# Patient Record
Sex: Male | Born: 1970
Health system: Southern US, Community
[De-identification: ages and names within clinical notes are randomized; demographics above are authoritative.]

## PROBLEM LIST (undated history)

## (undated) DIAGNOSIS — I10 Essential (primary) hypertension: Secondary | ICD-10-CM

## (undated) DIAGNOSIS — K219 Gastro-esophageal reflux disease without esophagitis: Secondary | ICD-10-CM

## (undated) HISTORY — PX: TONSILLECTOMY: SUR1361

## (undated) HISTORY — PX: VASECTOMY: SHX75

---

## 2000-06-23 ENCOUNTER — Emergency Department (HOSPITAL_COMMUNITY): Admission: EM | Admit: 2000-06-23 | Discharge: 2000-06-23 | Payer: Self-pay | Admitting: Emergency Medicine

## 2007-11-19 ENCOUNTER — Emergency Department (HOSPITAL_COMMUNITY): Admission: EM | Admit: 2007-11-19 | Discharge: 2007-11-19 | Payer: Self-pay | Admitting: Emergency Medicine

## 2008-11-22 ENCOUNTER — Emergency Department (HOSPITAL_COMMUNITY): Admission: EM | Admit: 2008-11-22 | Discharge: 2008-11-22 | Payer: Self-pay | Admitting: Family Medicine

## 2009-03-22 ENCOUNTER — Emergency Department (HOSPITAL_COMMUNITY): Admission: EM | Admit: 2009-03-22 | Discharge: 2009-03-22 | Payer: Self-pay | Admitting: Emergency Medicine

## 2011-04-21 LAB — URINALYSIS, ROUTINE W REFLEX MICROSCOPIC
Nitrite: NEGATIVE
Specific Gravity, Urine: 1.025
Urobilinogen, UA: 1
pH: 5.5

## 2011-04-21 LAB — COMPREHENSIVE METABOLIC PANEL
BUN: 21
CO2: 23
Calcium: 11.2 — ABNORMAL HIGH
Creatinine, Ser: 2.29 — ABNORMAL HIGH
GFR calc Af Amer: 40 — ABNORMAL LOW
GFR calc non Af Amer: 33 — ABNORMAL LOW
Glucose, Bld: 124 — ABNORMAL HIGH
Total Bilirubin: 1.5 — ABNORMAL HIGH

## 2011-04-21 LAB — URINE MICROSCOPIC-ADD ON

## 2016-09-03 ENCOUNTER — Telehealth: Payer: Self-pay | Admitting: Cardiovascular Disease

## 2016-09-03 NOTE — Telephone Encounter (Signed)
[facility] 09/11/16 with Dr Allyson SabalBerry.  Records put with Dr Hazle CocaBerry's schedule for 09/11/16. lp

## 2016-09-04 ENCOUNTER — Telehealth (HOSPITAL_COMMUNITY): Payer: Self-pay

## 2016-09-04 NOTE — Telephone Encounter (Signed)
Encounter complete. 

## 2016-09-07 ENCOUNTER — Emergency Department (HOSPITAL_COMMUNITY): Payer: BLUE CROSS/BLUE SHIELD

## 2016-09-07 ENCOUNTER — Emergency Department (HOSPITAL_COMMUNITY)
Admission: EM | Admit: 2016-09-07 | Discharge: 2016-09-08 | Disposition: A | Discharge status: Home / Self Care | Payer: BLUE CROSS/BLUE SHIELD | Attending: Emergency Medicine | Admitting: Emergency Medicine

## 2016-09-07 ENCOUNTER — Encounter (HOSPITAL_COMMUNITY): Payer: Self-pay | Admitting: *Deleted

## 2016-09-07 DIAGNOSIS — Z7982 Long term (current) use of aspirin: Secondary | ICD-10-CM | POA: Insufficient documentation

## 2016-09-07 DIAGNOSIS — Z79899 Other long term (current) drug therapy: Secondary | ICD-10-CM | POA: Diagnosis not present

## 2016-09-07 DIAGNOSIS — I10 Essential (primary) hypertension: Secondary | ICD-10-CM | POA: Insufficient documentation

## 2016-09-07 DIAGNOSIS — R0789 Other chest pain: Secondary | ICD-10-CM | POA: Diagnosis not present

## 2016-09-07 HISTORY — DX: Gastro-esophageal reflux disease without esophagitis: K21.9

## 2016-09-07 HISTORY — DX: Essential (primary) hypertension: I10

## 2016-09-07 LAB — I-STAT TROPONIN, ED: Troponin i, poc: 0 ng/mL (ref 0.00–0.08)

## 2016-09-07 LAB — CBC
HCT: 41.7 % (ref 39.0–52.0)
Hemoglobin: 15 g/dL (ref 13.0–17.0)
MCH: 31.3 pg (ref 26.0–34.0)
MCHC: 36 g/dL (ref 30.0–36.0)
MCV: 86.9 fL (ref 78.0–100.0)
PLATELETS: 250 10*3/uL (ref 150–400)
RBC: 4.8 MIL/uL (ref 4.22–5.81)
RDW: 13.1 % (ref 11.5–15.5)
WBC: 8.9 10*3/uL (ref 4.0–10.5)

## 2016-09-07 LAB — BASIC METABOLIC PANEL
Anion gap: 8 (ref 5–15)
BUN: 16 mg/dL (ref 6–20)
CHLORIDE: 102 mmol/L (ref 101–111)
CO2: 26 mmol/L (ref 22–32)
CREATININE: 1.39 mg/dL — AB (ref 0.61–1.24)
Calcium: 9.3 mg/dL (ref 8.9–10.3)
GFR calc non Af Amer: >60 mL/min (ref >60)
GLUCOSE: 118 mg/dL — AB (ref 65–99)
Potassium: 3.9 mmol/L (ref 3.5–5.1)
Sodium: 136 mmol/L (ref 135–145)

## 2016-09-07 NOTE — ED Triage Notes (Signed)
Pt reports sweats and restlessness through the night, "felt like heartburn". Broke out into a sweat this afternoon and has felt tired since. Pt states that he is scheduled for stress test tomorrow after seeing his PCP for chest pain.

## 2016-09-07 NOTE — ED Provider Notes (Addendum)
[facility]-EMERGENCY DEPT Provider Note   CSN: [id number] Arrival date & time: 09/07/16  2026     History   Chief Complaint Chief Complaint  Patient presents with  . Fatigue    HPI Jorge Kramer is a 46 y.o. male.Patient reports that he's had intermittent chest tightness for one month. Last night he "possibly turned" in bed all night with heartburn. And was sweaty. This afternoon he had an episode lasting 1 minute where he became diaphoretic with no other symptoms denies chest pain or shortness of breath. No treatment prior to coming here today. He did treat himself with one dose of aspirin last night. He has seen his primary care physician Dr.Pharr for chest tightness over the past month and Dr. Renne Crigler has arranged for him to have an outpatient stress test tomorrow. Patient is presently asymptomatic. No other associated symptoms. Nothing makes symptoms better or worse.  HPI  Past Medical History:  Diagnosis Date  . GERD (gastroesophageal reflux disease)   . Hypertension     There are no active problems to display for this patient.   Past Surgical History:  Procedure Laterality Date  . TONSILLECTOMY    . VASECTOMY         Home Medications    Prior to Admission medications   Medication Sig Start Date End Date Taking? Authorizing Provider  aspirin 325 MG EC tablet Take 325 mg by mouth daily.   Yes Historical Provider, MD  losartan (COZAAR) 100 MG tablet Take 100 mg by mouth daily. 08/16/16  Yes Historical Provider, MD  pantoprazole (PROTONIX) 40 MG tablet Take 40 mg by mouth daily. 08/12/16  Yes Historical Provider, MD    Family History No family history on file.  Social History Social History  Substance Use Topics  . Smoking status: Never Smoker  . Smokeless tobacco: Never Used  . Alcohol use No     Allergies   Patient has no known allergies.   Review of Systems Review of Systems  Constitutional: Positive for diaphoresis and fatigue.       Patient has been  fatigued today, however he lie awake all night last night  HENT: Negative.   Respiratory: Negative.   Cardiovascular: Positive for chest pain.       Syncope  Gastrointestinal: Negative.   Musculoskeletal: Negative.   Skin: Negative.   Allergic/Immunologic: Negative.   Neurological: Negative.   Psychiatric/Behavioral: Negative.   All other systems reviewed and are negative.    Physical Exam Updated Vital Signs BP 132/89   Pulse 68   Temp 99 F (37.2 C)   Resp 21   Ht 5\' 8"  (1.727 m)   Wt 233 lb (105.7 kg)   SpO2 97%   BMI 35.43 kg/m   Physical Exam  Constitutional: He appears well-developed and well-nourished.  HENT:  Head: Normocephalic and atraumatic.  Eyes: Conjunctivae are normal. Pupils are equal, round, and reactive to light.  Neck: Neck supple. No tracheal deviation present. No thyromegaly present.  Cardiovascular: Normal rate and regular rhythm.   No murmur heard. Pulmonary/Chest: Effort normal and breath sounds normal.  Abdominal: Soft. Bowel sounds are normal. He exhibits no distension. There is no tenderness.  Musculoskeletal: Normal range of motion. He exhibits no edema or tenderness.  Neurological: He is alert. Coordination normal.  Skin: Skin is warm and dry. No rash noted.  Psychiatric: He has a normal mood and affect.  Nursing note and vitals reviewed.    ED Treatments / Results  Labs (all labs  ordered are listed, but only abnormal results are displayed) Labs Reviewed  BASIC METABOLIC PANEL - Abnormal; Notable for the following:       Result Value   Glucose, Bld 118 (*)    Creatinine, Ser 1.39 (*)    All other components within normal limits  CBC  I-STAT TROPOININ, ED   Results for orders placed or performed during the hospital encounter of 09/07/16  Basic metabolic panel  Result Value Ref Range   Sodium 136 135 - 145 mmol/L   Potassium 3.9 3.5 - 5.1 mmol/L   Chloride 102 101 - 111 mmol/L   CO2 26 22 - 32 mmol/L   Glucose, Bld 118 (H) 65  - 99 mg/dL   BUN 16 6 - 20 mg/dL   Creatinine, Ser 4.09 (H) 0.61 - 1.24 mg/dL   Calcium 9.3 8.9 - 81.1 mg/dL   GFR calc non Af Amer >60 >60 mL/min   GFR calc Af Amer >60 >60 mL/min   Anion gap 8 5 - 15  CBC  Result Value Ref Range   WBC 8.9 4.0 - 10.5 K/uL   RBC 4.80 4.22 - 5.81 MIL/uL   Hemoglobin 15.0 13.0 - 17.0 g/dL   HCT 91.4 78.2 - 95.6 %   MCV 86.9 78.0 - 100.0 fL   MCH 31.3 26.0 - 34.0 pg   MCHC 36.0 30.0 - 36.0 g/dL   RDW 21.3 08.6 - 57.8 %   Platelets 250 150 - 400 K/uL  I-stat troponin, ED  Result Value Ref Range   Troponin i, poc 0.00 0.00 - 0.08 ng/mL   Comment 3          I-stat troponin, ED  Result Value Ref Range   Troponin i, poc 0.00 0.00 - 0.08 ng/mL   Comment 3           Dg Chest 2 View  Result Date: 09/07/2016 CLINICAL DATA:  Anterior chest pain x5 days EXAM: CHEST  2 VIEW COMPARISON:  09/02/2016 CXR FINDINGS: The heart size and mediastinal contours are within normal limits. Both lungs are clear. The visualized skeletal structures are unremarkable. IMPRESSION: No active cardiopulmonary disease. Electronically Signed   By: Tollie Eth M.D.   On: 09/07/2016 21:19    EKG  EKG Interpretation  Date/Time:  Monday September 07 2016 20:32:44 EST Ventricular Rate:  61 PR Interval:    QRS Duration: 98 QT Interval:  434 QTC Calculation: 438 R Axis:   35 Text Interpretation:  Sinus rhythm Consider right atrial enlargement No old tracing to compare Confirmed by Ethelda Chick  MD, Antoin Dargis [phone]) on 09/07/2016 8:52:53 PM       Radiology Dg Chest 2 View  Result Date: 09/07/2016 CLINICAL DATA:  Anterior chest pain x5 days EXAM: CHEST  2 VIEW COMPARISON:  09/02/2016 CXR FINDINGS: The heart size and mediastinal contours are within normal limits. Both lungs are clear. The visualized skeletal structures are unremarkable. IMPRESSION: No active cardiopulmonary disease. Electronically Signed   By: Tollie Eth M.D.   On: 09/07/2016 21:19   Chest xray viewed by  me Procedures Procedures (including critical care time)  Medications Ordered in ED Medications - No data to display   Initial Impression / Assessment and Plan / ED Course  I have reviewed the triage vital signs and the nursing notes.  Pertinent labs & imaging results that were available during my care of the patient were reviewed by me and considered in my medical decision making (see chart for details).  12:30 AM patient remains asymptomatic. Plan keep scheduled appointment for stress test 3 PM today. He has follow-up appointment with cardiology in 3 days. Heart score equals 1 to 2 Final Clinical Impressions(s) / ED Diagnoses  Diagnosis#1 atypical chest pain #562mild renal insufficiency Final diagnoses:  None    New Prescriptions New Prescriptions   No medications on file     Doug SouSam Cecelia Graciano, MD 09/08/16 [id number]    Doug SouSam Haani Bakula, MD 09/08/16 [phone]

## 2016-09-07 NOTE — ED Notes (Signed)
Bed: WA08 Expected date:  Expected time:  Means of arrival:  Comments: Hold per charge 

## 2016-09-07 NOTE — ED Notes (Signed)
Pt reports he was seen by PCP and ekg/cxr was completed at that time.

## 2016-09-08 ENCOUNTER — Encounter (HOSPITAL_COMMUNITY): Payer: Self-pay

## 2016-09-08 ENCOUNTER — Ambulatory Visit (INDEPENDENT_AMBULATORY_CARE_PROVIDER_SITE_OTHER): Payer: BLUE CROSS/BLUE SHIELD | Admitting: Cardiovascular Disease

## 2016-09-08 ENCOUNTER — Encounter: Payer: Self-pay | Admitting: Cardiovascular Disease

## 2016-09-08 ENCOUNTER — Other Ambulatory Visit: Payer: Self-pay | Admitting: Cardiovascular Disease

## 2016-09-08 ENCOUNTER — Other Ambulatory Visit: Payer: Self-pay | Admitting: Internal Medicine

## 2016-09-08 ENCOUNTER — Ambulatory Visit (HOSPITAL_COMMUNITY)
Admission: RE | Admit: 2016-09-08 | Discharge: 2016-09-08 | Disposition: A | Discharge status: Home / Self Care | Payer: BLUE CROSS/BLUE SHIELD | Source: Ambulatory Visit | Attending: Cardiovascular Disease | Admitting: Cardiovascular Disease

## 2016-09-08 VITALS — BP 148/93 | HR 70 | Ht 68.0 in | Wt 235.4 lb

## 2016-09-08 DIAGNOSIS — I10 Essential (primary) hypertension: Secondary | ICD-10-CM | POA: Insufficient documentation

## 2016-09-08 DIAGNOSIS — R079 Chest pain, unspecified: Secondary | ICD-10-CM | POA: Insufficient documentation

## 2016-09-08 DIAGNOSIS — I200 Unstable angina: Secondary | ICD-10-CM

## 2016-09-08 DIAGNOSIS — Z01812 Encounter for preprocedural laboratory examination: Secondary | ICD-10-CM | POA: Diagnosis not present

## 2016-09-08 LAB — I-STAT TROPONIN, ED: TROPONIN I, POC: 0 ng/mL (ref 0.00–0.08)

## 2016-09-08 LAB — CBC WITH DIFFERENTIAL/PLATELET
BASOS PCT: 1 %
Basophils Absolute: 72 cells/uL (ref 0–200)
Eosinophils Absolute: 144 cells/uL (ref 15–500)
Eosinophils Relative: 2 %
HEMATOCRIT: 46.8 % (ref 38.5–50.0)
HEMOGLOBIN: 16.1 g/dL (ref 13.2–17.1)
LYMPHS ABS: 2232 {cells}/uL (ref 850–3900)
Lymphocytes Relative: 31 %
MCH: 30.8 pg (ref 27.0–33.0)
MCHC: 34.4 g/dL (ref 32.0–36.0)
MCV: 89.7 fL (ref 80.0–100.0)
MONO ABS: 792 {cells}/uL (ref 200–950)
MPV: 9.9 fL (ref 7.5–12.5)
Monocytes Relative: 11 %
NEUTROS ABS: 3960 {cells}/uL (ref 1500–7800)
NEUTROS PCT: 55 %
Platelets: 265 10*3/uL (ref 140–400)
RBC: 5.22 MIL/uL (ref 4.20–5.80)
RDW: 13.8 % (ref 11.0–15.0)
WBC: 7.2 10*3/uL (ref 3.8–10.8)

## 2016-09-08 LAB — LIPID PANEL
CHOLESTEROL: 284 mg/dL — AB (ref <200)
HDL: 37 mg/dL — AB (ref >40)
LDL Cholesterol: 194 mg/dL — ABNORMAL HIGH (ref <100)
TRIGLYCERIDES: 264 mg/dL — AB (ref <150)
Total CHOL/HDL Ratio: 7.7 Ratio — ABNORMAL HIGH (ref <5.0)
VLDL: 53 mg/dL — ABNORMAL HIGH (ref <30)

## 2016-09-08 LAB — APTT: aPTT: 29 s (ref 22–34)

## 2016-09-08 LAB — HEPATIC FUNCTION PANEL
ALBUMIN: 4.7 g/dL (ref 3.6–5.1)
ALK PHOS: 73 U/L (ref 40–115)
ALT: 63 U/L — AB (ref 9–46)
AST: 43 U/L — AB (ref 10–40)
Bilirubin, Direct: 0.1 mg/dL (ref <=0.2)
Indirect Bilirubin: 0.7 mg/dL (ref 0.2–1.2)
TOTAL PROTEIN: 7.3 g/dL (ref 6.1–8.1)
Total Bilirubin: 0.8 mg/dL (ref 0.2–1.2)

## 2016-09-08 LAB — PROTIME-INR
INR: 1
PROTHROMBIN TIME: 10.6 s (ref 9.0–11.5)

## 2016-09-08 NOTE — Discharge Instructions (Signed)
Keep your scheduled appointment for a stress test later today. Keep your scheduled appointment with Dr.Berry in 3 days. Return if concern for any reason

## 2016-09-08 NOTE — Assessment & Plan Note (Signed)
History of hypertension blood pressure measured today at 148/93. He is on losartan. Continue current meds at current dosing

## 2016-09-08 NOTE — Progress Notes (Signed)
09/08/2016 Jorge Kramer   10/16/1970  [id number]  Primary Physician Jorge Kramer,Jorge DAVIDSON, MD Primary Cardiologist: Runell GessJonathan J Chico Cawood MD Jorge RenoFACP, FACC, FAHA, FSCAI  HPI:  Mr. Jorge Conferpperson is a delightful 46 year old mildly overweight married Caucasian male father of  2 children and is accompanied by his wife Jorge Kramer who is a Engineer, civil (consulting)nurse at [employer].He is a Production designer, theatre/television/filmmanager at [employer].  He is being seen today for evaluation of new onset dyspnea on exertion over the last month as well as 3 episodes of chest pain lasting 30 seconds at time associated with diaphoresis. He was seen in the emergency room last night and ruled out for myocardial infarction. His EKG showed no acute changes. He was scheduled for a routine GXT this afternoon.   Current Outpatient Prescriptions  Medication Sig Dispense Refill  . aspirin 325 MG EC tablet Take 325 mg by mouth daily.    Marland Kitchen. losartan (COZAAR) 100 MG tablet Take 100 mg by mouth daily.  98  . pantoprazole (PROTONIX) 40 MG tablet Take 40 mg by mouth daily.  3   No current facility-administered medications for this visit.     No Known Allergies  Social History   Social History  . Marital status: Married    Spouse name: N/A  . Number of children: N/A  . Years of education: N/A   Occupational History  . Not on file.   Social History Main Topics  . Smoking status: Never Smoker  . Smokeless tobacco: Never Used  . Alcohol use No  . Drug use: No  . Sexual activity: Not on file   Other Topics Concern  . Not on file   Social History Narrative  . No narrative on file     Review of Systems: General: negative for chills, fever, night sweats or weight changes.  Cardiovascular: negative for chest pain, dyspnea on exertion, edema, orthopnea, palpitations, paroxysmal nocturnal dyspnea or shortness of breath Dermatological: negative for rash Respiratory: negative for cough or wheezing Urologic: negative for hematuria Abdominal: negative for nausea,  vomiting, diarrhea, bright red blood per rectum, melena, or hematemesis Neurologic: negative for visual changes, syncope, or dizziness All other systems reviewed and are otherwise negative except as noted above.    Blood pressure (!) 148/93, pulse 70, height 5\' 8"  (1.727 m), weight 235 lb 6.4 oz (106.8 kg), SpO2 95 %.  General appearance: alert and no distress Neck: no adenopathy, no carotid bruit, no JVD, supple, symmetrical, trachea midline and thyroid not enlarged, symmetric, no tenderness/mass/nodules Lungs: clear to auscultation bilaterally Heart: regular rate and rhythm, S1, S2 normal, no murmur, click, rub or gallop Extremities: extremities normal, atraumatic, no cyanosis or edema  EKG not performed today, EKG performed yesterday in the emergency room showed sinus rhythm at 61 without ST or T segment changes. I personally reviewed this EKG  ASSESSMENT AND PLAN:   Essential hypertension History of hypertension blood pressure measured today at 148/93. He is on losartan. Continue current meds at current dosing  Chest pain Jorge Kramer presents today for evaluation of new onset dyspnea on exertion over the last month and 3 episodes of chest pain over the last week or so lasting 30 seconds at a time associated with diaphoresis. Sclerae factors are hypertension and untreated hyperlipidemia. He was scheduled for a routine GXT this afternoon. I decide to proceed directly to cardiac catheterization to define his anatomy.The patient understands that risks included but are not limited to stroke (1 in 1000), death (1 in 1000), kidney  failure [usually temporary] (1 in 500), bleeding (1 in 200), allergic reaction [possibly serious] (1 in 200). The patient understands and agrees to proceed      Runell Gess MD [facility] 09/08/2016 11:56 AM

## 2016-09-08 NOTE — Assessment & Plan Note (Signed)
[name] presents today for evaluation of new onset dyspnea on exertion over the last month and 3 episodes of chest pain over the last week or so lasting 30 seconds at a time associated with diaphoresis. Sclerae factors are hypertension and untreated hyperlipidemia. He was scheduled for a routine GXT this afternoon. I decide to proceed directly to cardiac catheterization to define his anatomy.The patient understands that risks included but are not limited to stroke (1 in 1000), death (1 in 1000), kidney failure [usually temporary] (1 in 500), bleeding (1 in 200), allergic reaction [possibly serious] (1 in 200). The patient understands and agrees to proceed

## 2016-09-08 NOTE — Patient Instructions (Addendum)
Your physician has requested that you have a cardiac catheterization on Thursday, 09/10/16 with Dr. Allyson SabalBerry (Dx: Chest pain). Cardiac catheterization is used to diagnose and/or treat various heart conditions. Doctors may recommend this procedure for a number of different reasons. The most common reason is to evaluate chest pain. Chest pain can be a symptom of coronary artery disease (CAD), and cardiac catheterization can show whether plaque is narrowing or blocking your heart's arteries. This procedure is also used to evaluate the valves, as well as measure the blood flow and oxygen levels in different parts of your heart. For further information please visit [email or web address].   Following your catheterization, you will not be allowed to drive for 3 days.  No lifting, pushing, or pulling greater that 10 pounds is allowed for 1 week.  You will be required to have the following tests prior to the procedure:  1. Blood work-the blood work can be done no more than 14 days prior to the procedure.  It can be done at any [facility] lab.  There is one downstairs on the first floor of this building and one in the [facility] building [phone]([address], [address]).   Puncture site Rt. Radial  ---CANCEL STRESS TEST---  Your physician has recommended that you have a sleep study in 1-2 months. This test records several body functions during sleep, including: brain activity, eye movement, oxygen and carbon dioxide blood levels, heart rate and rhythm, breathing rate and rhythm, the flow of air through your mouth and nose, snoring, body muscle movements, and chest and belly movement.

## 2016-09-09 LAB — TSH: TSH: 8.26 mIU/L — ABNORMAL HIGH (ref 0.40–4.50)

## 2016-09-10 ENCOUNTER — Encounter (HOSPITAL_COMMUNITY)
Admission: RE | Disposition: A | Discharge status: Home / Self Care | Payer: Self-pay | Source: Ambulatory Visit | Attending: Cardiovascular Disease

## 2016-09-10 ENCOUNTER — Ambulatory Visit (HOSPITAL_COMMUNITY)
Admission: RE | Admit: 2016-09-10 | Discharge: 2016-09-10 | Disposition: A | Discharge status: Home / Self Care | Payer: BLUE CROSS/BLUE SHIELD | Source: Ambulatory Visit | Attending: Cardiovascular Disease | Admitting: Cardiovascular Disease

## 2016-09-10 DIAGNOSIS — R079 Chest pain, unspecified: Secondary | ICD-10-CM

## 2016-09-10 DIAGNOSIS — E663 Overweight: Secondary | ICD-10-CM | POA: Diagnosis not present

## 2016-09-10 DIAGNOSIS — Z7982 Long term (current) use of aspirin: Secondary | ICD-10-CM | POA: Insufficient documentation

## 2016-09-10 DIAGNOSIS — I200 Unstable angina: Secondary | ICD-10-CM | POA: Diagnosis not present

## 2016-09-10 DIAGNOSIS — I10 Essential (primary) hypertension: Secondary | ICD-10-CM | POA: Insufficient documentation

## 2016-09-10 DIAGNOSIS — Z6835 Body mass index (BMI) 35.0-35.9, adult: Secondary | ICD-10-CM | POA: Insufficient documentation

## 2016-09-10 DIAGNOSIS — E785 Hyperlipidemia, unspecified: Secondary | ICD-10-CM | POA: Insufficient documentation

## 2016-09-10 HISTORY — PX: LEFT HEART CATH AND CORONARY ANGIOGRAPHY: CATH118249

## 2016-09-10 SURGERY — LEFT HEART CATH AND CORONARY ANGIOGRAPHY
Anesthesia: LOCAL

## 2016-09-10 MED ORDER — HEPARIN (PORCINE) IN NACL 2-0.9 UNIT/ML-% IJ SOLN
INTRAMUSCULAR | Status: DC | PRN
  Administered 2016-09-10: 1000 mL

## 2016-09-10 MED ORDER — HEPARIN (PORCINE) IN NACL 2-0.9 UNIT/ML-% IJ SOLN
INTRAMUSCULAR | Status: AC
  Filled 2016-09-10: qty 1000

## 2016-09-10 MED ORDER — LIDOCAINE HCL (PF) 1 % IJ SOLN
INTRAMUSCULAR | Status: AC
  Filled 2016-09-10: qty 30

## 2016-09-10 MED ORDER — SODIUM CHLORIDE 0.9 % WEIGHT BASED INFUSION
3.0000 mL/kg/h | INTRAVENOUS | Status: DC
  Administered 2016-09-10: 3 mL/kg/h via INTRAVENOUS

## 2016-09-10 MED ORDER — IOPAMIDOL (ISOVUE-370) INJECTION 76%
INTRAVENOUS | Status: AC
Start: 2016-09-10 — End: 2016-09-10
  Filled 2016-09-10: qty 100

## 2016-09-10 MED ORDER — VERAPAMIL HCL 2.5 MG/ML IV SOLN
INTRAVENOUS | Status: AC
  Filled 2016-09-10: qty 2

## 2016-09-10 MED ORDER — IOPAMIDOL (ISOVUE-370) INJECTION 76%
INTRAVENOUS | Status: DC | PRN
  Administered 2016-09-10: 60 mL via INTRA_ARTERIAL

## 2016-09-10 MED ORDER — SODIUM CHLORIDE 0.9 % IV SOLN: INTRAVENOUS | Status: AC

## 2016-09-10 MED ORDER — MORPHINE SULFATE (PF) 4 MG/ML IV SOLN: 2.0000 mg | INTRAVENOUS | Status: DC | PRN

## 2016-09-10 MED ORDER — SODIUM CHLORIDE 0.9% FLUSH: 3.0000 mL | INTRAVENOUS | Status: DC | PRN

## 2016-09-10 MED ORDER — ASPIRIN 81 MG PO CHEW: 81.0000 mg | CHEWABLE_TABLET | ORAL | Status: DC

## 2016-09-10 MED ORDER — ONDANSETRON HCL 4 MG/2ML IJ SOLN: 4.0000 mg | Freq: Four times a day (QID) | INTRAMUSCULAR | Status: DC | PRN

## 2016-09-10 MED ORDER — SODIUM CHLORIDE 0.9 % WEIGHT BASED INFUSION: 1.0000 mL/kg/h | INTRAVENOUS | Status: DC

## 2016-09-10 MED ORDER — SODIUM CHLORIDE 0.9 % IV SOLN: 250.0000 mL | INTRAVENOUS | Status: DC | PRN

## 2016-09-10 MED ORDER — NITROGLYCERIN 1 MG/10 ML FOR IR/CATH LAB
INTRA_ARTERIAL | Status: AC
  Filled 2016-09-10: qty 10

## 2016-09-10 MED ORDER — HEPARIN SODIUM (PORCINE) 1000 UNIT/ML IJ SOLN
INTRAMUSCULAR | Status: AC
  Filled 2016-09-10: qty 1

## 2016-09-10 MED ORDER — SODIUM CHLORIDE 0.9% FLUSH: 3.0000 mL | Freq: Two times a day (BID) | INTRAVENOUS | Status: DC

## 2016-09-10 MED ORDER — LIDOCAINE HCL (PF) 1 % IJ SOLN
INTRAMUSCULAR | Status: DC | PRN
  Administered 2016-09-10: 2 mL via INTRADERMAL

## 2016-09-10 MED ORDER — VERAPAMIL HCL 2.5 MG/ML IV SOLN
INTRA_ARTERIAL | Status: DC | PRN
  Administered 2016-09-10: 10 mL via INTRA_ARTERIAL

## 2016-09-10 MED ORDER — ACETAMINOPHEN 325 MG PO TABS: 650.0000 mg | ORAL_TABLET | ORAL | Status: DC | PRN

## 2016-09-10 MED ORDER — HEPARIN SODIUM (PORCINE) 1000 UNIT/ML IJ SOLN
INTRAMUSCULAR | Status: DC | PRN
  Administered 2016-09-10: 5000 [IU] via INTRAVENOUS

## 2016-09-10 SURGICAL SUPPLY — 13 items
CATH EXPO 5F FL3.5 (CATHETERS) ×1 IMPLANT
CATH EXPO 5FR ANG PIGTAIL 145 (CATHETERS) ×1 IMPLANT
CATH OPTITORQUE TIG 4.0 5F (CATHETERS) ×1 IMPLANT
DEVICE RAD COMP TR BAND LRG (VASCULAR PRODUCTS) ×1 IMPLANT
GLIDESHEATH SLEND A-KIT 6F 22G (SHEATH) ×1 IMPLANT
GUIDEWIRE INQWIRE 1.5J.035X260 (WIRE) IMPLANT
INQWIRE 1.5J .035X260CM (WIRE) ×2
KIT HEART LEFT (KITS) ×2 IMPLANT
PACK CARDIAC CATHETERIZATION (CUSTOM PROCEDURE TRAY) ×2 IMPLANT
SYR MEDRAD MARK V 150ML (SYRINGE) ×2 IMPLANT
TRANSDUCER W/STOPCOCK (MISCELLANEOUS) ×2 IMPLANT
TUBING CIL FLEX 10 FLL-RA (TUBING) ×2 IMPLANT
WIRE HI TORQ VERSACORE-J 145CM (WIRE) ×1 IMPLANT

## 2016-09-10 NOTE — Interval H&P Note (Signed)
Cath Lab Visit (complete for each Cath Lab visit)  Clinical Evaluation Leading to the Procedure:   ACS: No.  Non-ACS:    Anginal Classification: CCS II  Anti-ischemic medical therapy: No Therapy  Non-Invasive Test Results: No non-invasive testing performed  Prior CABG: No previous CABG      Cath Lab Visit (complete for each Cath Lab visit)  Clinical Evaluation Leading to the Procedure:   ACS: No.  Non-ACS:    Anginal Classification: CCS II  Anti-ischemic medical therapy: No Therapy  Non-Invasive Test Results: No non-invasive testing performed  Prior CABG: No previous CABG      History and Physical Interval Note:  09/10/2016 2:44 PM  Jorge Kramer  has presented today for surgery, with the diagnosis of cp  The various methods of treatment have been discussed with the patient and family. After consideration of risks, benefits and other options for treatment, the patient has consented to  Procedure(s): Left Heart Cath and Coronary Angiography (N/A) as a surgical intervention .  The patient's history has been reviewed, patient examined, no change in status, stable for surgery.  I have reviewed the patient's chart and labs.  Questions were answered to the patient's satisfaction.     Nanetta BattyBerry, Guss Farruggia

## 2016-09-10 NOTE — H&P (View-Only) (Signed)
   09/08/2016 Jorge Kramer   02/09/1971  [id number]  Primary Physician PHARR,WALTER DAVIDSON, MD Primary Cardiologist: Anjanette Gilkey J Sheikh Leverich MD FACP, FACC, FAHA, FSCAI  HPI:  [name] is a delightful 46-year-old mildly overweight married Caucasian male father of  2 children and is accompanied by his wife [name] who is a nurse at [employer].He is a manager at [employer].  He is being seen today for evaluation of new onset dyspnea on exertion over the last month as well as 3 episodes of chest pain lasting 30 seconds at time associated with diaphoresis. He was seen in the emergency room last night and ruled out for myocardial infarction. His EKG showed no acute changes. He was scheduled for a routine GXT this afternoon.   Current Outpatient Prescriptions  Medication Sig Dispense Refill  . aspirin 325 MG EC tablet Take 325 mg by mouth daily.    . losartan (COZAAR) 100 MG tablet Take 100 mg by mouth daily.  98  . pantoprazole (PROTONIX) 40 MG tablet Take 40 mg by mouth daily.  3   No current facility-administered medications for this visit.     No Known Allergies  Social History   Social History  . Marital status: Married    Spouse name: N/A  . Number of children: N/A  . Years of education: N/A   Occupational History  . Not on file.   Social History Main Topics  . Smoking status: Never Smoker  . Smokeless tobacco: Never Used  . Alcohol use No  . Drug use: No  . Sexual activity: Not on file   Other Topics Concern  . Not on file   Social History Narrative  . No narrative on file     Review of Systems: General: negative for chills, fever, night sweats or weight changes.  Cardiovascular: negative for chest pain, dyspnea on exertion, edema, orthopnea, palpitations, paroxysmal nocturnal dyspnea or shortness of breath Dermatological: negative for rash Respiratory: negative for cough or wheezing Urologic: negative for hematuria Abdominal: negative for nausea,  vomiting, diarrhea, bright red blood per rectum, melena, or hematemesis Neurologic: negative for visual changes, syncope, or dizziness All other systems reviewed and are otherwise negative except as noted above.    Blood pressure (!) 148/93, pulse 70, height 5' 8" (1.727 m), weight 235 lb 6.4 oz (106.8 kg), SpO2 95 %.  General appearance: alert and no distress Neck: no adenopathy, no carotid bruit, no JVD, supple, symmetrical, trachea midline and thyroid not enlarged, symmetric, no tenderness/mass/nodules Lungs: clear to auscultation bilaterally Heart: regular rate and rhythm, S1, S2 normal, no murmur, click, rub or gallop Extremities: extremities normal, atraumatic, no cyanosis or edema  EKG not performed today, EKG performed yesterday in the emergency room showed sinus rhythm at 61 without ST or T segment changes. I personally reviewed this EKG  ASSESSMENT AND PLAN:   Essential hypertension History of hypertension blood pressure measured today at 148/93. He is on losartan. Continue current meds at current dosing  Chest pain Mr. Eckenrode presents today for evaluation of new onset dyspnea on exertion over the last month and 3 episodes of chest pain over the last week or so lasting 30 seconds at a time associated with diaphoresis. Sclerae factors are hypertension and untreated hyperlipidemia. He was scheduled for a routine GXT this afternoon. I decide to proceed directly to cardiac catheterization to define his anatomy.The patient understands that risks included but are not limited to stroke (1 in 1000), death (1 in 1000), kidney   failure [usually temporary] (1 in 500), bleeding (1 in 200), allergic reaction [possibly serious] (1 in 200). The patient understands and agrees to proceed      Srihitha Tagliaferri J. Natacha Jepsen MD FACP,FACC,FAHA, FSCAI 09/08/2016 11:56 AM 

## 2016-09-10 NOTE — Discharge Instructions (Signed)

## 2016-09-11 ENCOUNTER — Encounter (HOSPITAL_COMMUNITY): Payer: Self-pay | Admitting: Cardiovascular Disease

## 2016-09-11 ENCOUNTER — Ambulatory Visit: Payer: Self-pay | Admitting: Cardiovascular Disease

## 2016-09-30 ENCOUNTER — Encounter: Payer: Self-pay | Admitting: Cardiovascular Disease

## 2016-09-30 ENCOUNTER — Ambulatory Visit (INDEPENDENT_AMBULATORY_CARE_PROVIDER_SITE_OTHER): Payer: BLUE CROSS/BLUE SHIELD | Admitting: Cardiovascular Disease

## 2016-09-30 DIAGNOSIS — E7800 Pure hypercholesterolemia, unspecified: Secondary | ICD-10-CM

## 2016-09-30 DIAGNOSIS — E785 Hyperlipidemia, unspecified: Secondary | ICD-10-CM | POA: Insufficient documentation

## 2016-09-30 DIAGNOSIS — I10 Essential (primary) hypertension: Secondary | ICD-10-CM | POA: Diagnosis not present

## 2016-09-30 DIAGNOSIS — I209 Angina pectoris, unspecified: Secondary | ICD-10-CM

## 2016-09-30 DIAGNOSIS — I259 Chronic ischemic heart disease, unspecified: Secondary | ICD-10-CM

## 2016-09-30 NOTE — Progress Notes (Signed)
09/30/2016 GRE[facility]   [birth year]  [id number]  Primary Physician Londell Moh, MD Primary Cardiologist: Runell Gess MD Roseanne Reno  HPI:  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 He was seen in the emergency room last night and ruled out for myocardial infarction. His EKG showed no acute changes. He was scheduled for a routine GXT , but because of the new onset of his symptoms I elected to proceed directly to cardiac cath to rule out ischemia. I perform this on 09/10/16 radially demonstrating normal coronary arteries and normal LV function. He was also found to have significant hyper lipidemia currently not on statin therapy as well as hypothyroidism. He was begun on thyroid replacement therapy symptoms are somewhat improved.   Current Outpatient Prescriptions  Medication Sig Dispense Refill  . aspirin EC 81 MG tablet Take 81 mg by mouth daily.    Marland Kitchen ibuprofen (ADVIL,MOTRIN) 200 MG tablet Take 600 mg by mouth every 6 (six) hours as needed for headache or mild pain.    Marland Kitchen losartan (COZAAR) 100 MG tablet Take 100 mg by mouth daily.  98  . pantoprazole (PROTONIX) 40 MG tablet Take 40 mg by mouth daily.  3  . levothyroxine (SYNTHROID, LEVOTHROID) 50 MCG tablet Take 1 tablet by mouth daily.  11   No current facility-administered medications for this visit.     No Known Allergies  Social History   Social History  . Marital status: Married    Spouse name: N/A  . Number of children: N/A  . Years of education: N/A   Occupational History  . Not on file.   Social History Main Topics  . Smoking status: Never Smoker  . Smokeless  tobacco: Never Used  . Alcohol use No  . Drug use: No  . Sexual activity: Not on file   Other Topics Concern  . Not on file   Social History Narrative  . No narrative on file     Review of Systems: General: negative for chills, fever, night sweats or weight changes.  Cardiovascular: negative for chest pain, dyspnea on exertion, edema, orthopnea, palpitations, paroxysmal nocturnal dyspnea or shortness of breath Dermatological: negative for rash Respiratory: negative for cough or wheezing Urologic: negative for hematuria Abdominal: negative for nausea, vomiting, diarrhea, bright red blood per rectum, melena, or hematemesis Neurologic: negative for visual changes, syncope, or dizziness All other systems reviewed and are otherwise negative except as noted above.    Blood pressure 130/86, pulse 68, height 5\' 8"  (1.727 m), weight 232 lb 6.4 oz (105.4 kg).  General appearance: alert and no distress Neck: no adenopathy, no carotid bruit, no JVD, supple, symmetrical, trachea midline and thyroid not enlarged, symmetric, no tenderness/mass/nodules Lungs: clear to auscultation bilaterally Heart: regular rate and rhythm, S1, S2 normal, no murmur, click, rub or gallop Extremities: extremities normal, atraumatic, no cyanosis or edema  EKG not performed today  ASSESSMENT AND PLAN:   Essential hypertension History of hypertension blood pressure measured 130/86. He is on losartan. Continue current meds at current dosing  Chest pain History of chest pain with positive risk factors. Recent crit catheterization performed radially by  myself 09/10/16 was entirely normal. His symptoms have somewhat improved after discovering that he was hypothyroid and being placed on thyroid replacement therapy.  Hyperlipidemia History of hyperlipidemia currently not on statin therapy with recent lipid profile performed 09/08/16 revealing a total cholesterol 284, LDL 194 and HDL of 37. He has changed his diet  significantly since being made aware of these results. I suspect that he will ultimately need statin therapy. I will leave this to Dr. Renne CriglerPharr to treat.      Runell GessJonathan J. Berry MD FACP,FACC,FAHA, [facility] 09/30/2016 9:33 AM

## 2016-09-30 NOTE — Patient Instructions (Signed)
Medication Instructions: Your physician recommends that you continue on your current medications as directed. Please refer to the Current Medication list given to you today.   Follow-Up: Your physician recommends that you schedule a follow-up appointment as needed with Dr. Berry.  If you need a refill on your cardiac medications before your next appointment, please call your pharmacy.  

## 2016-09-30 NOTE — Assessment & Plan Note (Signed)
History of chest pain with positive risk factors. Recent crit catheterization performed radially by myself 09/10/16 was entirely normal. His symptoms have somewhat improved after discovering that he was hypothyroid and being placed on thyroid replacement therapy.

## 2016-09-30 NOTE — Assessment & Plan Note (Signed)
History of hyperlipidemia currently not on statin therapy with recent lipid profile performed 09/08/16 revealing a total cholesterol 284, LDL 194 and HDL of 37. He has changed his diet significantly since being made aware of these results. I suspect that he will ultimately need statin therapy. I will leave this to Dr. Renne CriglerPharr to treat.

## 2016-09-30 NOTE — Assessment & Plan Note (Signed)
History of hypertension blood pressure measured 130/86. He is on losartan. Continue current meds at current dosing

## 2016-10-26 ENCOUNTER — Encounter (HOSPITAL_BASED_OUTPATIENT_CLINIC_OR_DEPARTMENT_OTHER): Payer: BLUE CROSS/BLUE SHIELD

## 2019-01-17 ENCOUNTER — Other Ambulatory Visit: Payer: Self-pay

## 2019-01-17 ENCOUNTER — Ambulatory Visit (INDEPENDENT_AMBULATORY_CARE_PROVIDER_SITE_OTHER): Payer: No Typology Code available for payment source | Admitting: Orthopaedic Surgery

## 2019-01-17 ENCOUNTER — Ambulatory Visit (INDEPENDENT_AMBULATORY_CARE_PROVIDER_SITE_OTHER): Payer: No Typology Code available for payment source

## 2019-01-17 DIAGNOSIS — G8929 Other chronic pain: Secondary | ICD-10-CM

## 2019-01-17 DIAGNOSIS — M25561 Pain in right knee: Secondary | ICD-10-CM | POA: Diagnosis not present

## 2019-01-17 NOTE — Progress Notes (Signed)
Office Visit Note   Patient: Jorge Kramer           Date of Birth: [birth year]           MRN: [id number] Visit Date: 01/17/2019              Requested by: Deland Pretty, MD [address] [place],  [place] [address] PCP: Deland Pretty, MD   Assessment & Plan: Visit Diagnoses:  1. Chronic pain of right knee     Plan: I feel that at this point he does need an MRI of his right knee to better assess the cartilage on the medial meniscus.  He is tried and failed conservative treatment for 3 years now including multiple steroid injections, activity modification, quad strengthening exercises, anti-inflammatories, rest and time.  Given his x-ray findings and his clinical exam findings I do feel this is medically warranted at this standpoint to further assess the medial compartment of his knee given his findings.  All question concerns were answered and addressed.  We will see him back once he has the MRI of his right knee.  Follow-Up Instructions: No follow-ups on file.  The patient will call for follow-up appointment once he has an MRI date.  Orders:  Orders Placed This Encounter  Procedures  . XR Knee 1-2 Views Right   No orders of the defined types were placed in this encounter.     Procedures: No procedures performed   Clinical Data: No additional findings.   Subjective: Chief Complaint  Patient presents with  . Right Knee - Pain  The patient is a very pleasant and active 48 year old gentleman that I am seeing for the first time but has been followed by another orthopedic surgeon in town for many years now.  He comes in with a chief complaint of right knee pain.  He does have intermittent swelling in that knee.  It hurts with exercise activities and putting weight through the medial side of his right knee is where he points to.  He has been having some locking and catching as well.  He has had the knee aspirated on several occasions.  He has had 3 steroid  injections in his right knee over the last 2 to 3 years.  He still has the recurrent symptoms of instability of that knee.  He is not obese.  He is very active and strong.  He is worked on a home exercise program to strengthen his quad muscles which are quite evident on just inspection.  He is not a diabetic and not a smoker.  He used to run quite a bit but he cannot do that now due to his right knee pain.  HPI  Review of Systems He currently denies any headache, chest pain, shortness of breath, fever, chills, nausea, vomiting  Objective: Vital Signs: There were no vitals taken for this visit.  Physical Exam He is alert and orient x3 and in no acute distress Ortho Exam Examination of his right knee shows slight varus malalignment that is easily correctable.  His extension is entirely full and his flexion is full.  He has significant medial joint line tenderness.  He has a positive McMurray sign to the medial compartment. Specialty Comments:  No specialty comments available.  Imaging: Xr Knee 1-2 Views Right  Result Date: 01/17/2019 2 views of the right knee show varus malalignment with significant medial joint space narrowing.  There are para-articular osteophytes in the medial and patellofemoral compartments.  PMFS History: Patient Active Problem List   Diagnosis Date Noted  . Hyperlipidemia 09/30/2016  . Essential hypertension 09/08/2016  . Chest pain 09/08/2016   Past Medical History:  Diagnosis Date  . GERD (gastroesophageal reflux disease)   . Hypertension     No family history on file.  Past Surgical History:  Procedure Laterality Date  . LEFT HEART CATH AND CORONARY ANGIOGRAPHY N/A 09/10/2016   Procedure: Left Heart Cath and Coronary Angiography;  Surgeon: Runell GessJonathan J Berry, MD;  [facility];  Service: Cardiovascular;  Laterality: N/A;  . TONSILLECTOMY    . VASECTOMY     Social History   Occupational History  . Not on file  Tobacco Use  .  Smoking status: Never Smoker  . Smokeless tobacco: Never Used  Substance and Sexual Activity  . Alcohol use: No  . Drug use: No  . Sexual activity: Not on file

## 2019-01-20 ENCOUNTER — Telehealth: Payer: Self-pay

## 2019-01-20 ENCOUNTER — Other Ambulatory Visit: Payer: Self-pay | Admitting: Orthopaedic Surgery

## 2019-01-20 MED ORDER — IBUPROFEN 800 MG PO TABS: 800.0000 mg | ORAL_TABLET | Freq: Three times a day (TID) | ORAL | 2 refills | Status: DC | PRN

## 2019-01-20 NOTE — Telephone Encounter (Signed)
Message left on Sabrina's voicemail. Pt is requesting a rx for Motrin 800 mg.

## 2019-01-20 NOTE — Telephone Encounter (Signed)
I called patient and advised.  Patient also left voicemail for Gabriel Cirri stating his insurance does not cover [facility] and would like for him to go to [facility] for MRI.  Fax number for [facility] is [phone].  Please send MRI order there.

## 2019-01-20 NOTE — Telephone Encounter (Signed)
I'll send some in. 

## 2019-01-20 NOTE — Telephone Encounter (Signed)
Want to call him in some 800 ibuprofen?

## 2019-01-24 NOTE — Telephone Encounter (Signed)
I will, working on it now.  Will update the referral, and I have called patient and LM

## 2019-01-24 NOTE — Telephone Encounter (Signed)
Looks like authorization was being worked on by [name]. [name] can you change to [place], thanks!

## 2019-02-07 ENCOUNTER — Encounter: Payer: Self-pay | Admitting: Orthopaedic Surgery

## 2019-02-07 ENCOUNTER — Other Ambulatory Visit: Payer: Self-pay

## 2019-02-07 ENCOUNTER — Ambulatory Visit (INDEPENDENT_AMBULATORY_CARE_PROVIDER_SITE_OTHER): Payer: No Typology Code available for payment source | Admitting: Orthopaedic Surgery

## 2019-02-07 DIAGNOSIS — M25561 Pain in right knee: Secondary | ICD-10-CM

## 2019-02-07 DIAGNOSIS — G8929 Other chronic pain: Secondary | ICD-10-CM

## 2019-02-07 DIAGNOSIS — S83241D Other tear of medial meniscus, current injury, right knee, subsequent encounter: Secondary | ICD-10-CM | POA: Diagnosis not present

## 2019-02-07 DIAGNOSIS — M25461 Effusion, right knee: Secondary | ICD-10-CM

## 2019-02-07 MED ORDER — METHYLPREDNISOLONE ACETATE 40 MG/ML IJ SUSP
40.0000 mg | INTRAMUSCULAR | Status: AC | PRN
  Administered 2019-02-07: 40 mg via INTRA_ARTICULAR

## 2019-02-07 MED ORDER — LIDOCAINE HCL 1 % IJ SOLN
3.0000 mL | INTRAMUSCULAR | Status: AC | PRN
  Administered 2019-02-07: 3 mL

## 2019-02-07 NOTE — Progress Notes (Signed)
Office Visit Note   Patient: Jorge Kramer           Date of Birth: 07/09/1971           MRN: [id number] Visit Date: 02/07/2019              Requested by: Merri BrunettePharr, Walter, MD [address] [place],  [place] [id number] PCP: Merri BrunettePharr, Walter, MD   Assessment & Plan: Visit Diagnoses: No diagnosis found.  Plan: I was able to aspirate 35 to 40 cc of fluid from his knee and then placed a steroid injection in the right knee.  This will help him on his trip.  For now I am recommending arthroscopic intervention of his knee with a partial medial meniscectomy and chondroplasty given the fact that he is only 48 years old.  If we can help temporize his symptoms and buy him some time before knee replacement surgery that would be worthwhile.  I showed him a knee model explained in detail what this involves and he is for proceeding with a knee scope in the future.  He does have our surgery scheduler's card.  He will call and let us know.  The other option would be considering a knee replacement.  Follow-Up Instructions: No follow-ups on file.   Orders:  No orders of the defined types were placed in this encounter.  No orders of the defined types were placed in this encounter.     Procedures: Large Joint Inj: R knee on 02/07/2019 9:55 AM Indications: diagnostic evaluation and pain Details: 22 G 1.5 in needle, superolateral approach  Arthrogram: No  Medications: 3 mL lidocaine 1 %; 40 mg methylPREDNISolone acetate 40 MG/ML Outcome: tolerated well, no immediate complications Procedure, treatment alternatives, risks and benefits explained, specific risks discussed. Consent was given by the patient. Immediately prior to procedure a time out was called to verify the correct patient, procedure, equipment, support staff and site/side marked as required. Patient was prepped and draped in the usual sterile fashion.       Clinical Data: No additional findings.   Subjective: Chief  Complaint  Patient presents with  . Right Knee - Follow-up  The patient comes in today to go over an MRI of his right knee.  He is a 48 year old gentleman knees had locking and catching with his knee as well as recurrent effusions.  He usually gets a steroid injection and aspiration a couple times a year.  The last for about 3 months.  The plain films of his knee did show varus malalignment with medial joint space narrowing and so was sent for an MRI to better evaluate the cartilage in the meniscus.  He has had a recurrent effusion again.  He is heading on vacation later this week and will be gone for over a week.  It is about 11-hour drive as well. HPI  Review of Systems He currently denies any headache, chest pain, shortness of breath, fever, chills, nausea, vomiting  Objective: Vital Signs: There were no vitals taken for this visit.  Physical Exam He is alert and orient x3 and in no acute distress Ortho Exam Examination of his right knee does show a recurrent effusion with varus malalignment.  Is got good range of motion the knee but medial joint line tenderness. Specialty Comments:  No specialty comments available.  Imaging: No results found. The MRI is independently reviewed with him and it does show a complex medial meniscal tear with areas of full-thickness cartilage loss  in the medial compartment of his knee.  There is some thinning of the cartilage on the lateral compartment and some cartilage loss of the patellofemoral joint.  PMFS History: Patient Active Problem List   Diagnosis Date Noted  . Hyperlipidemia 09/30/2016  . Essential hypertension 09/08/2016  . Chest pain 09/08/2016   Past Medical History:  Diagnosis Date  . GERD (gastroesophageal reflux disease)   . Hypertension     History reviewed. No pertinent family history.  Past Surgical History:  Procedure Laterality Date  . LEFT HEART CATH AND CORONARY ANGIOGRAPHY N/A 09/10/2016   Procedure: Left Heart Cath and  Coronary Angiography;  Surgeon: Lorretta Harp, MD;  Location: [facility];  Service: Cardiovascular;  Laterality: N/A;  . TONSILLECTOMY    . VASECTOMY     Social History   Occupational History  . Not on file  Tobacco Use  . Smoking status: Never Smoker  . Smokeless tobacco: Never Used  Substance and Sexual Activity  . Alcohol use: No  . Drug use: No  . Sexual activity: Not on file

## 2019-02-10 ENCOUNTER — Other Ambulatory Visit: Payer: No Typology Code available for payment source

## 2019-02-17 ENCOUNTER — Telehealth: Payer: Self-pay

## 2019-02-17 NOTE — Telephone Encounter (Signed)
Patient left voice mail regarding scheduling knee scope.  Will you please do surgery sheet?  I called him back and left voice mail for a return call.

## 2019-02-17 NOTE — Telephone Encounter (Signed)
The surgery that I would like to perform on him would be a right knee arthroscopy with a partial medial meniscectomy.  This is for an acute right knee medial meniscal tear.  The CPT code is [id number].  This could hopefully be done at the surgical center, but if not, in a lower location will do.

## 2019-02-23 NOTE — Telephone Encounter (Signed)
I called and scheduled surgery. 

## 2019-03-23 ENCOUNTER — Other Ambulatory Visit: Payer: Self-pay | Admitting: Orthopaedic Surgery

## 2019-03-23 ENCOUNTER — Telehealth: Payer: Self-pay | Admitting: Orthopaedic Surgery

## 2019-03-23 DIAGNOSIS — M23231 Derangement of other medial meniscus due to old tear or injury, right knee: Secondary | ICD-10-CM

## 2019-03-23 MED ORDER — HYDROCODONE-ACETAMINOPHEN 5-325 MG PO TABS: 1.0000 | ORAL_TABLET | Freq: Four times a day (QID) | ORAL | 0 refills | Status: DC | PRN

## 2019-03-23 NOTE — Telephone Encounter (Signed)
I can't find where we took them out of work?

## 2019-03-23 NOTE — Telephone Encounter (Signed)
LMOM for patient wife the message below

## 2019-03-23 NOTE — Telephone Encounter (Signed)
Patient's wife Cecille Rubin asked when can patient return to work. The number to contact patient is [phone]

## 2019-03-23 NOTE — Telephone Encounter (Signed)
Since we did surgery just this morning, I would have him out of work thru next week.

## 2019-03-27 ENCOUNTER — Telehealth: Payer: Self-pay | Admitting: Orthopaedic Surgery

## 2019-03-27 NOTE — Telephone Encounter (Signed)
Pt called in said he recently had surgery with dr.blackman and he needs to get clarification on rehab and instructions on what to do.   [phone]

## 2019-03-27 NOTE — Telephone Encounter (Signed)
Patient aware he may full weight bear and bend the knee slightly

## 2019-03-30 ENCOUNTER — Encounter: Payer: Self-pay | Admitting: Physician Assistant

## 2019-03-30 ENCOUNTER — Ambulatory Visit (INDEPENDENT_AMBULATORY_CARE_PROVIDER_SITE_OTHER): Payer: No Typology Code available for payment source | Admitting: Physician Assistant

## 2019-03-30 VITALS — Ht 68.0 in

## 2019-03-30 DIAGNOSIS — Z9889 Other specified postprocedural states: Secondary | ICD-10-CM

## 2019-03-30 NOTE — Progress Notes (Signed)
HPI: Jorge Kramer returns today follow-up 1 week status post right knee arthroscopy.  Again he was found to have wide areas of full-thickness cartilage loss medial femoral condyle and tibial plateau.  Also a large medial meniscal tear which was debrided performing a partial medial meniscectomy.  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  Lateral joint was pristine.  Patient is overall doing well.  Has no complaints.  He has had no chest pain shortness of breath.  He does note some stiffness with flexion.  Physical exam: Right knee good range of motion right calf supple nontender port sites well approximated with nylon sutures no signs of infection.  Impression 1 week status post right knee arthroscopy with partial medial meniscectomy  Plan: He will work on range of motion strengthening the knee.  Discussed knee friendly exercises with him discussed weight loss.  He will work on scar tissue mobilization.  Follow-up with [place] in 4 weeks sooner if there is any questions or concerns.

## 2019-04-27 ENCOUNTER — Encounter: Payer: Self-pay | Admitting: Orthopaedic Surgery

## 2019-04-27 ENCOUNTER — Telehealth: Payer: Self-pay

## 2019-04-27 ENCOUNTER — Ambulatory Visit (INDEPENDENT_AMBULATORY_CARE_PROVIDER_SITE_OTHER): Payer: No Typology Code available for payment source | Admitting: Orthopaedic Surgery

## 2019-04-27 DIAGNOSIS — Z9889 Other specified postprocedural states: Secondary | ICD-10-CM

## 2019-04-27 DIAGNOSIS — M1711 Unilateral primary osteoarthritis, right knee: Secondary | ICD-10-CM

## 2019-04-27 NOTE — Progress Notes (Signed)
The patient is a 48 year old gentleman who is in follow-up status post a right knee arthroscopy.  He is about a month out from surgery.  We found extensive areas of full-thickness cartilage loss in the medial femoral condyle and the medial tibial plateau.  The remainder of his knee was intact.  He is still experiencing medial joint line tenderness but is doing well overall.  We went over his arthroscopy pictures again shown the extent of the arthritic changes in the medial compartment.  His ACL and PCL are intact and the lateral compartment is pristine.  The knee feels ligamentously stable as well.  There is no significant effusion on exam.  At this point we will continue to increase his exercise routine with strengthening his quad muscles.  He will avoid high impact aerobic activities.  He can ride an exercise bike.  At this point I feel it is medically necessary that hyaluronic acid the ordered and approved for his knee and I do feel this would help him given the extent of cartilage loss in his medial compartment.  The only other treatment would be considering a partial knee arthroplasty.  I do feel that it is essential that we try hyaluronic acid for his knee at this point and he agrees if we get insurance to agree to this as well.  Again he has an area of wide full-thickness cartilage loss on the medial femoral condyle and the medial tibial plateau.  We will see him back in 4 weeks to hopefully place hyaluronic acid into the right knee.  Of also recommended Voltaren gel.

## 2019-04-27 NOTE — Telephone Encounter (Signed)
Right knee gel injection  

## 2019-04-29 ENCOUNTER — Other Ambulatory Visit: Payer: Self-pay | Admitting: Orthopaedic Surgery

## 2019-05-01 NOTE — Telephone Encounter (Signed)
Noted  

## 2019-05-03 ENCOUNTER — Telehealth: Payer: Self-pay

## 2019-05-03 NOTE — Telephone Encounter (Signed)
Submitted VOB for Monovisc, right knee. 

## 2019-05-18 ENCOUNTER — Telehealth: Payer: Self-pay

## 2019-05-18 NOTE — Telephone Encounter (Signed)
Called and left a VM for patient to CB to R/S appointment due to the office moving and not reopening until Wednesday, 05/24/2019 and not sure if approval for gel injection will be faxed before appointment.

## 2019-05-24 ENCOUNTER — Ambulatory Visit: Payer: No Typology Code available for payment source | Admitting: Orthopaedic Surgery

## 2019-06-02 ENCOUNTER — Encounter: Payer: Self-pay | Admitting: Radiology

## 2019-06-02 NOTE — Progress Notes (Signed)
No auth needed for any injection or infusions per [place] A at [facility].  Ref # [id number]- this is in regards to [place].  Ok to buy and bill.

## 2019-06-05 ENCOUNTER — Other Ambulatory Visit: Payer: Self-pay

## 2019-06-05 ENCOUNTER — Encounter: Payer: Self-pay | Admitting: Orthopaedic Surgery

## 2019-06-05 ENCOUNTER — Ambulatory Visit (INDEPENDENT_AMBULATORY_CARE_PROVIDER_SITE_OTHER): Payer: No Typology Code available for payment source | Admitting: Orthopaedic Surgery

## 2019-06-05 DIAGNOSIS — M25561 Pain in right knee: Secondary | ICD-10-CM

## 2019-06-05 DIAGNOSIS — M1711 Unilateral primary osteoarthritis, right knee: Secondary | ICD-10-CM

## 2019-06-05 DIAGNOSIS — M25461 Effusion, right knee: Secondary | ICD-10-CM

## 2019-06-05 MED ORDER — HYALURONAN 88 MG/4ML IX SOSY
88.0000 mg | PREFILLED_SYRINGE | INTRA_ARTICULAR | Status: AC | PRN
  Administered 2019-06-05: 14:00:00 88 mg via INTRA_ARTICULAR

## 2019-06-05 NOTE — Progress Notes (Signed)
   Procedure Note  Patient: Jorge Kramer             Date of Birth: March 18, 1971           MRN: [id number]             Visit Date: 06/05/2019  Procedures: Visit Diagnoses:  1. Unilateral primary osteoarthritis, right knee     Large Joint Inj: R knee on 06/05/2019 2:21 PM Indications: diagnostic evaluation and pain Details: 22 G 1.5 in needle, superolateral approach  Arthrogram: No  Medications: 88 mg Hyaluronan 88 MG/4ML Outcome: tolerated well, no immediate complications Procedure, treatment alternatives, risks and benefits explained, specific risks discussed. Consent was given by the patient. Immediately prior to procedure a time out was called to verify the correct patient, procedure, equipment, support staff and site/side marked as required. Patient was prepped and draped in the usual sterile fashion.    The patient comes in today for scheduled hyaluronic acid injection with Monovisc into his right knee to treat the pain from osteoarthritis.  He has tried and failed other forms of conservative treatment including steroid injection.  He does have known significant arthritis mainly in the medial compartment of his right knee.  He is only 48 years old.  On exam today there is just a slight effusion of his right knee.  It is ligamentously stable on exam and he has good range of motion of it.  He tolerated the Monovisc injection well in his right knee.  All question concerns were answered and addressed.  I told him about coming back for steroid injection at some point if he needed 1 at least 3 months down the road only if he needed it in a repeat hyaluronic acid injection only if needed.  Follow-up as otherwise as needed.

## 2019-06-20 ENCOUNTER — Other Ambulatory Visit: Payer: Self-pay | Admitting: Surgery

## 2019-06-20 DIAGNOSIS — R590 Localized enlarged lymph nodes: Secondary | ICD-10-CM

## 2019-06-29 ENCOUNTER — Ambulatory Visit
Admission: RE | Admit: 2019-06-29 | Discharge: 2019-06-29 | Disposition: A | Discharge status: Home / Self Care | Payer: No Typology Code available for payment source | Source: Ambulatory Visit | Attending: Surgery | Admitting: Surgery

## 2019-06-29 ENCOUNTER — Other Ambulatory Visit: Payer: Self-pay | Admitting: Surgery

## 2019-06-29 DIAGNOSIS — R590 Localized enlarged lymph nodes: Secondary | ICD-10-CM

## 2019-06-29 DIAGNOSIS — R591 Generalized enlarged lymph nodes: Secondary | ICD-10-CM

## 2019-06-29 MED ORDER — IOPAMIDOL (ISOVUE-300) INJECTION 61%
100.0000 mL | Freq: Once | INTRAVENOUS | Status: AC | PRN
  Administered 2019-06-29: 100 mL via INTRAVENOUS

## 2019-07-04 ENCOUNTER — Other Ambulatory Visit: Payer: Self-pay | Admitting: Surgery

## 2019-07-04 DIAGNOSIS — R1909 Other intra-abdominal and pelvic swelling, mass and lump: Secondary | ICD-10-CM

## 2019-07-04 DIAGNOSIS — R590 Localized enlarged lymph nodes: Secondary | ICD-10-CM

## 2019-07-10 ENCOUNTER — Ambulatory Visit
Admission: RE | Admit: 2019-07-10 | Discharge: 2019-07-10 | Disposition: A | Discharge status: Home / Self Care | Payer: No Typology Code available for payment source | Source: Ambulatory Visit | Attending: Surgery | Admitting: Surgery

## 2019-07-10 DIAGNOSIS — R1909 Other intra-abdominal and pelvic swelling, mass and lump: Secondary | ICD-10-CM

## 2019-07-19 ENCOUNTER — Other Ambulatory Visit: Payer: Self-pay

## 2019-07-19 ENCOUNTER — Ambulatory Visit
Admission: RE | Admit: 2019-07-19 | Discharge: 2019-07-19 | Disposition: A | Discharge status: Home / Self Care | Payer: No Typology Code available for payment source | Source: Ambulatory Visit | Attending: Surgery | Admitting: Surgery

## 2019-07-19 DIAGNOSIS — R590 Localized enlarged lymph nodes: Secondary | ICD-10-CM

## 2019-10-23 ENCOUNTER — Other Ambulatory Visit: Payer: Self-pay | Admitting: Orthopaedic Surgery

## 2019-10-23 NOTE — Telephone Encounter (Signed)
Ok to rf? 

## 2020-05-07 ENCOUNTER — Other Ambulatory Visit (HOSPITAL_COMMUNITY): Payer: Self-pay | Admitting: Internal Medicine

## 2020-05-07 MED FILL — AZITHROMYCIN 250 MG TABLET: 250 | 5 days supply | Qty: 6 | Fill #0

## 2020-09-25 ENCOUNTER — Other Ambulatory Visit: Payer: Self-pay | Admitting: Internal Medicine

## 2020-09-25 DIAGNOSIS — E7800 Pure hypercholesterolemia, unspecified: Secondary | ICD-10-CM

## 2021-03-27 ENCOUNTER — Other Ambulatory Visit (HOSPITAL_COMMUNITY): Payer: Self-pay

## 2021-12-02 IMAGING — CT CT ABD-PELV W/ CM
2 of 5 series · 13 of 46 positions shown, 15 images · IV contrast (iopamidol)
Comparison: None.

CLINICAL DATA: 48-year-old male with right axillary
mass/adenopathy.

EXAM:
CT ABDOMEN AND PELVIS WITH CONTRAST
TECHNIQUE: Multidetector CT imaging of the abdomen and pelvis was performed
using the standard protocol following bolus administration of
intravenous contrast.
CONTRAST:  100mL GYW2QF-NCC IOPAMIDOL (GYW2QF-NCC) INJECTION 61%

[Series 2: abd pelvis 5.00 br40 s3 axial · axial · 0.69mm/px · z∈[+1173,+1583]mm · 10 of 97 slices shown, 12 images]
[im 10/97  soft-tissue]
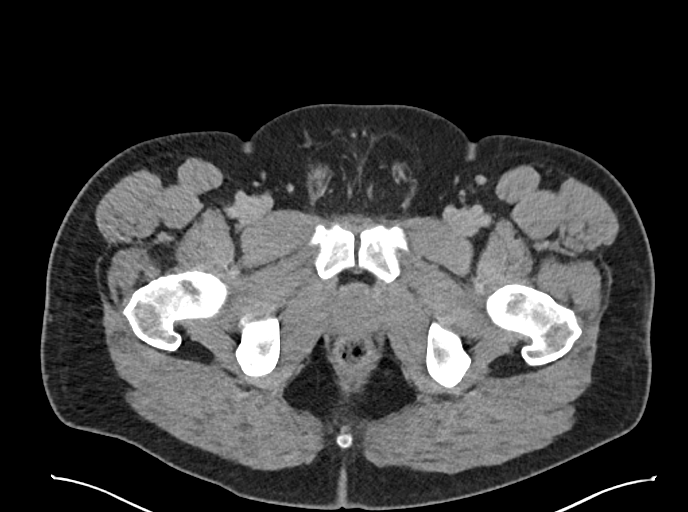
[im 10/97  bone]
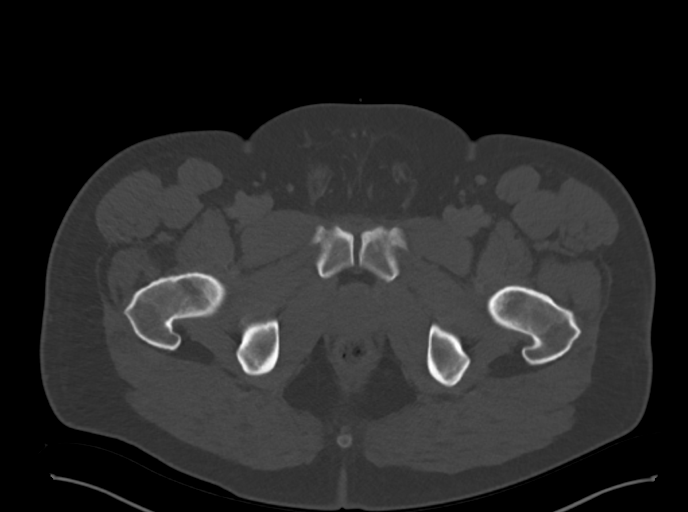
[im 19/97  soft-tissue]
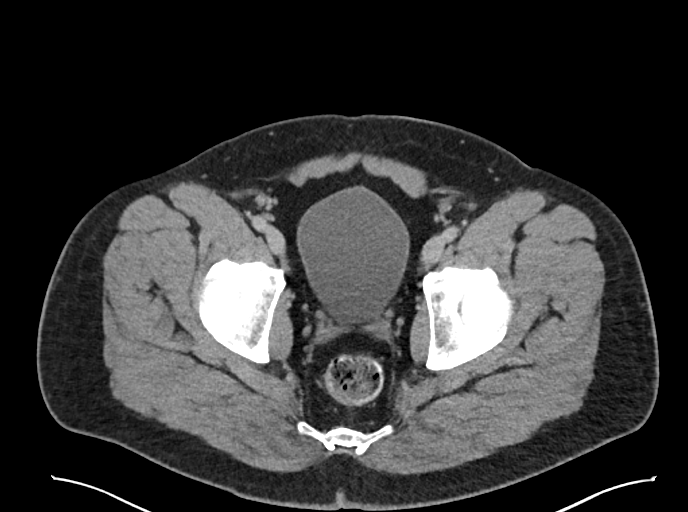
[im 28/97  soft-tissue]
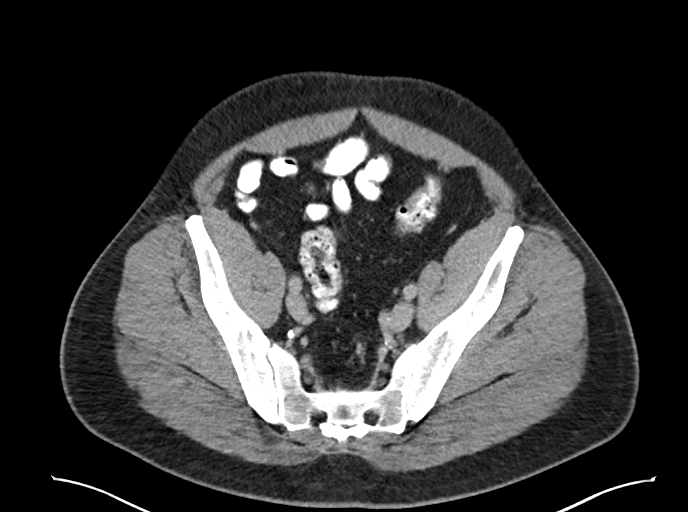
[im 37/97  soft-tissue]
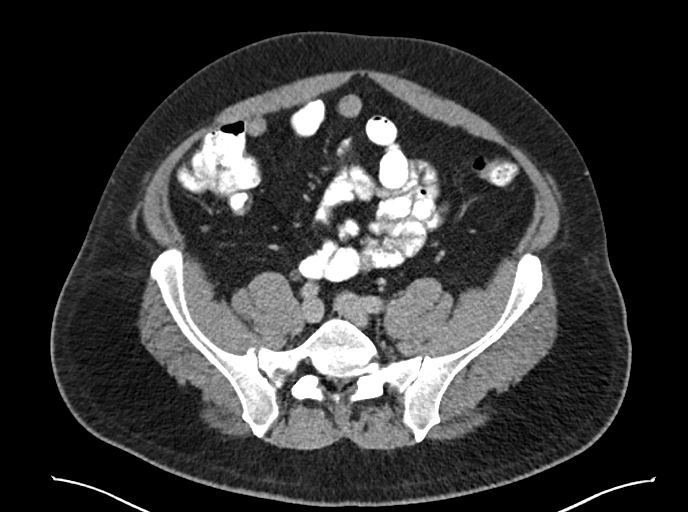
[im 46/97  soft-tissue]
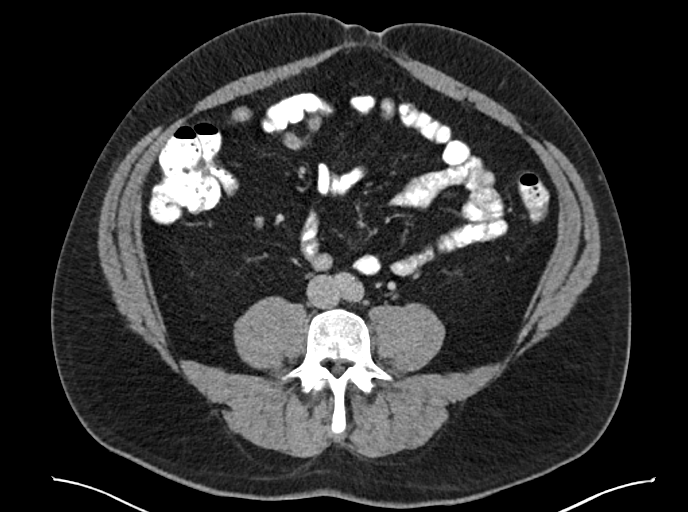
[im 55/97  soft-tissue]
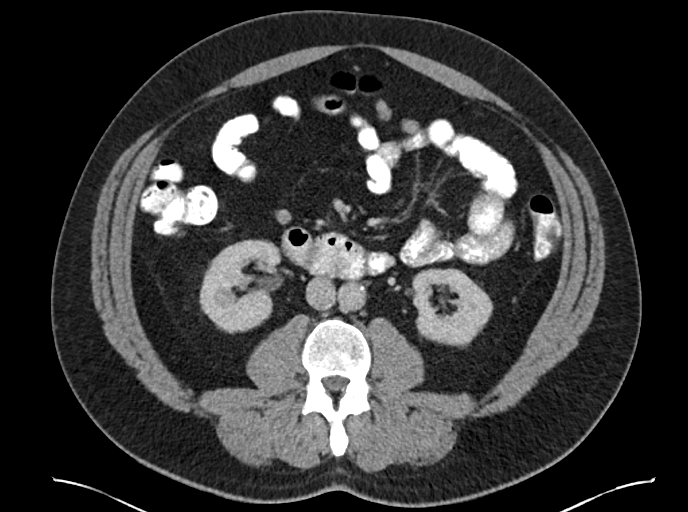
[im 65/97  soft-tissue]
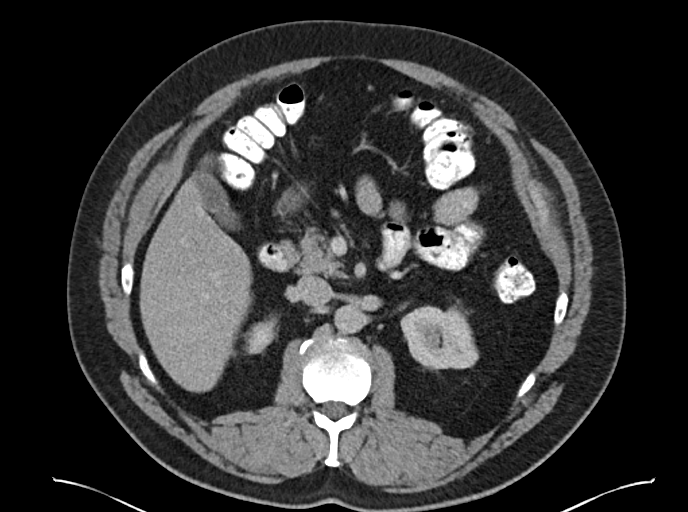
[im 74/97  soft-tissue]
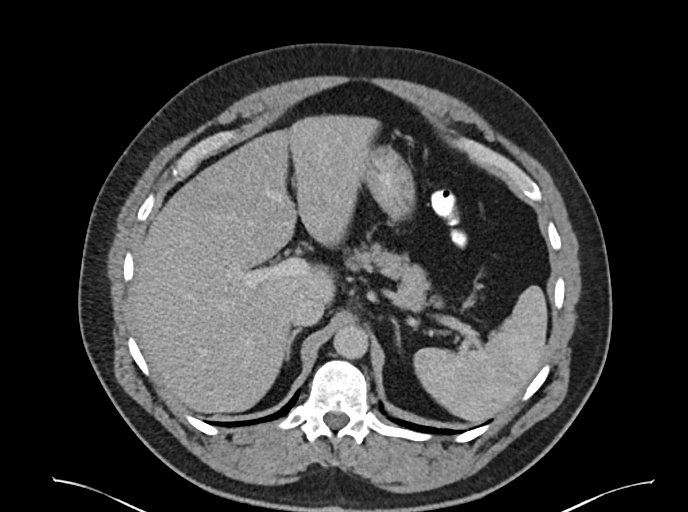
[im 83/97  soft-tissue]
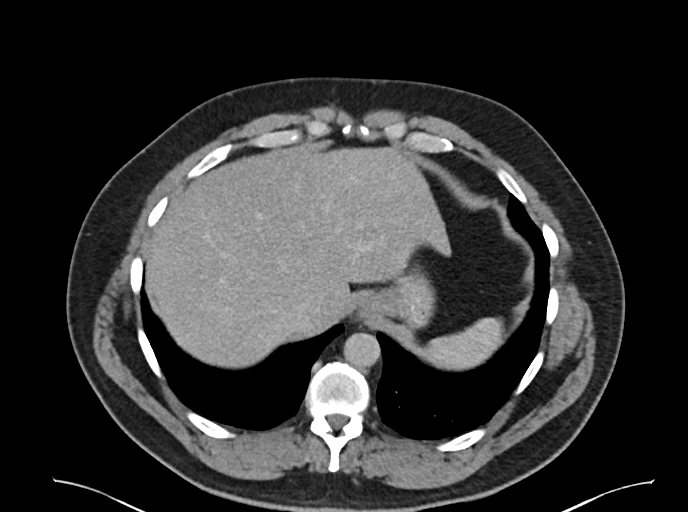
[im 83/97  bone]
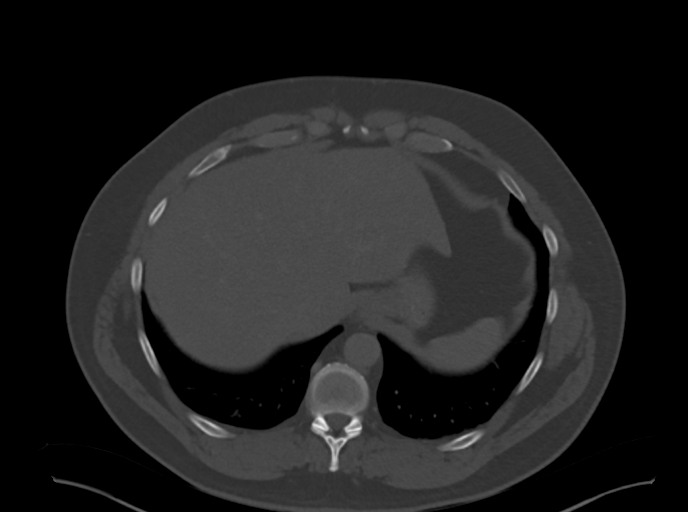
[im 92/97  soft-tissue]
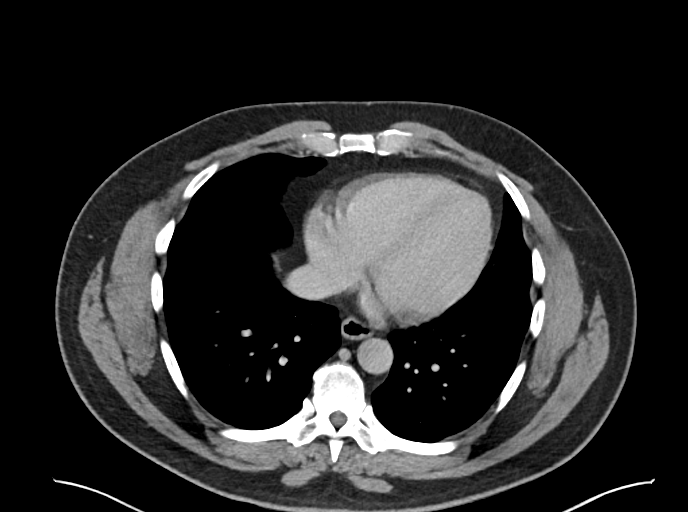

[Series 6: abd pelvis 2.00 br40 s3 cor · coronal · 0.85mm/px · 3 of 174 slices shown]
[im 58/174  soft-tissue]
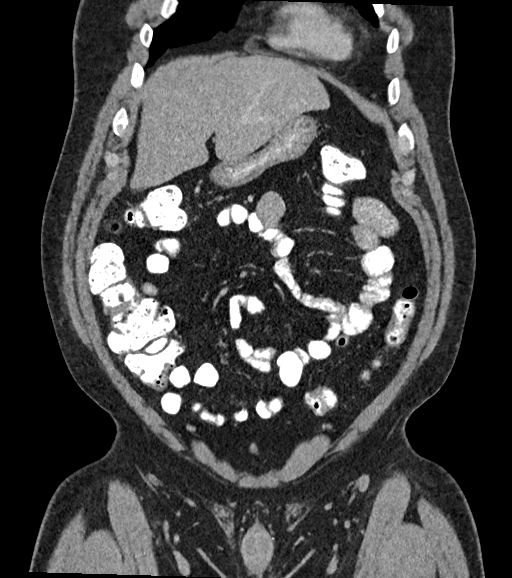
[im 77/174  soft-tissue]
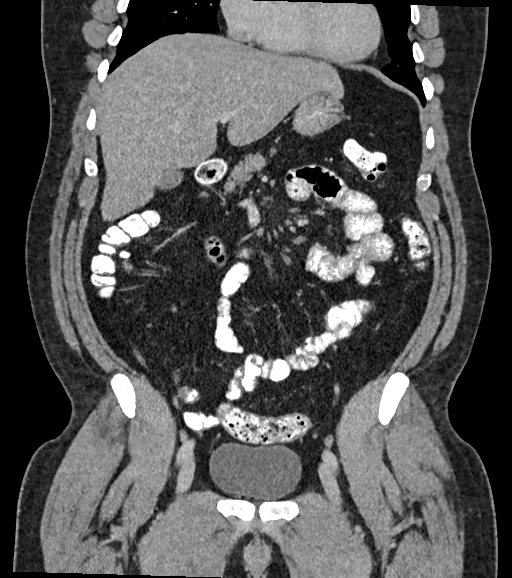
[im 97/174  soft-tissue]
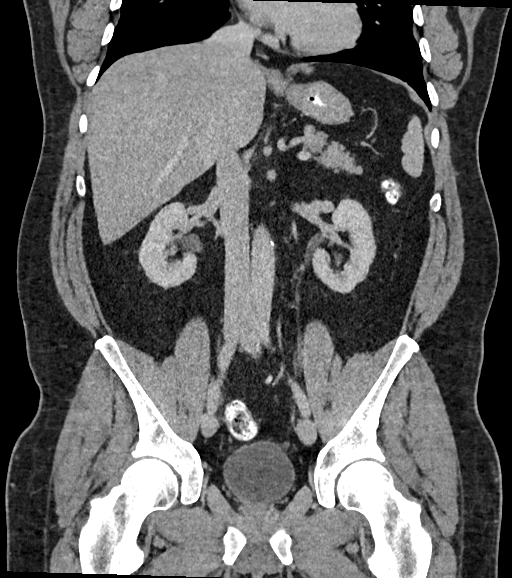

[13 of 46 positions shown; findings below may reference images not displayed]

FINDINGS: Lower chest: The visualized lung bases are clear.

No intra-abdominal free air or free fluid.

Hepatobiliary: Probable mild fatty infiltration of the liver. No
intrahepatic biliary ductal dilatation. The gallbladder is
unremarkable.

Pancreas: Unremarkable. No pancreatic ductal dilatation or
surrounding inflammatory changes.

Spleen: Normal in size without focal abnormality.

Adrenals/Urinary Tract: The adrenal glands are unremarkable. The
kidneys, visualized ureters, and urinary bladder appear
unremarkable. There is symmetric enhancement and excretion of
contrast by both kidneys.

Stomach/Bowel: There is no bowel obstruction or active inflammation.
The appendix is normal.

Vascular/Lymphatic: The abdominal aorta and IVC are unremarkable.
The SMV, splenic vein and main portal vein are patent. No portal
venous gas. There is no adenopathy.

Reproductive: The prostate and seminal vesicles are grossly
unremarkable. No pelvic mass

Other: Small fat containing umbilical hernia.

Musculoskeletal: No acute or significant osseous findings.
IMPRESSION: No acute intra-abdominal or pelvic pathology.

## 2023-08-13 ENCOUNTER — Ambulatory Visit (HOSPITAL_BASED_OUTPATIENT_CLINIC_OR_DEPARTMENT_OTHER): Payer: Managed Care, Other (non HMO) | Admitting: Student

## 2023-08-13 ENCOUNTER — Ambulatory Visit (HOSPITAL_BASED_OUTPATIENT_CLINIC_OR_DEPARTMENT_OTHER): Payer: No Typology Code available for payment source

## 2023-08-13 DIAGNOSIS — M25562 Pain in left knee: Secondary | ICD-10-CM | POA: Diagnosis not present

## 2023-08-13 DIAGNOSIS — M1712 Unilateral primary osteoarthritis, left knee: Secondary | ICD-10-CM | POA: Diagnosis not present

## 2023-08-13 MED ORDER — TRIAMCINOLONE ACETONIDE 40 MG/ML IJ SUSP
2.0000 mL | INTRAMUSCULAR | Status: AC | PRN
  Administered 2023-08-13: 2 mL via INTRA_ARTICULAR

## 2023-08-13 MED ORDER — LIDOCAINE HCL 1 % IJ SOLN
4.0000 mL | INTRAMUSCULAR | Status: AC | PRN
  Administered 2023-08-13: 4 mL

## 2023-08-13 NOTE — Progress Notes (Signed)
Chief Complaint: Left knee pain     History of Present Illness:    Jorge Kramer is a 53 y.o. male here today for evaluation of pain in his left knee.  Denies any specific injury but states that this began about 1 month ago after he had been ice-skating in [employer] center.  Pain is located mainly over the medial knee.  He rates this as moderate and that pain and stiffness tend to worsen with periods of inactivity which then improves with motion.  Denies any swelling.  Takes occasional ibuprofen 800 mg for pain.  Does have history of a right knee meniscus tear and arthroscopy with partial medial meniscectomy in 2020.  Also has arthritis in the right knee for which he received a hyaluronic acid injection in 2020 which is continued to do well.   Surgical History:   None  PMH/PSH/Family History/Social History/Meds/Allergies:    Past Medical History:  Diagnosis Date   GERD (gastroesophageal reflux disease)    Hypertension    Past Surgical History:  Procedure Laterality Date   LEFT HEART CATH AND CORONARY ANGIOGRAPHY N/A 09/10/2016   Procedure: Left Heart Cath and Coronary Angiography;  Surgeon: Runell Gess, MD;  Location: [facility] INVASIVE CV LAB;  Service: Cardiovascular;  Laterality: N/A;   TONSILLECTOMY     VASECTOMY     Social History   Socioeconomic History   Marital status: Married    Spouse name: Not on file   Number of children: Not on file   Years of education: Not on file   Highest education level: Not on file  Occupational History   Not on file  Tobacco Use   Smoking status: Never   Smokeless tobacco: Never  Substance and Sexual Activity   Alcohol use: No   Drug use: No   Sexual activity: Not on file  Other Topics Concern   Not on file  Social History Narrative   Not on file   Social Drivers of Health   Financial Resource Strain: Not on file  Food Insecurity: Not on file  Transportation Needs: Not on file  Physical  Activity: Not on file  Stress: Not on file  Social Connections: Unknown (12/07/2021)   Received from [facility]   Social Network    Social Network: Not on file   No family history on file. No Known Allergies Current Outpatient Medications  Medication Sig Dispense Refill   aspirin EC 81 MG tablet Take 81 mg by mouth daily.     HYDROcodone-acetaminophen (NORCO/VICODIN) 5-325 MG tablet Take 1-2 tablets by mouth every 6 (six) hours as needed for moderate pain. 40 tablet 0   ibuprofen (ADVIL) 800 MG tablet TAKE 1 TABLET BY MOUTH EVERY 8 HOURS AS NEEDED 60 tablet 2   levothyroxine (SYNTHROID, LEVOTHROID) 50 MCG tablet Take 1 tablet by mouth daily.  11   losartan (COZAAR) 100 MG tablet Take 100 mg by mouth daily.  98   pantoprazole (PROTONIX) 40 MG tablet Take 40 mg by mouth daily.  3   No current facility-administered medications for this visit.   No results found.  Review of Systems:   A ROS was performed including pertinent positives and negatives as documented in the HPI.  Physical Exam :   Constitutional: NAD and appears stated age Neurological: Alert and oriented  Psych: Appropriate affect and cooperative There were no vitals taken for this visit.   Comprehensive Musculoskeletal Exam:    Left knee exam demonstrates active range of motion from 0 to 130 degrees with mild crepitus.  Tenderness palpation over the medial joint line.  No evidence of erythema or effusion.  No instability with varus or valgus stress.  Knee flexion/extension strength is 5/5.  Imaging:   Xray (left knee 4 views): Tricompartmental osteoarthritis most notable in the medial compartment with osteophyte of the medial femoral condyle.  Slight varus deformity.  Patellofemoral spurring.   I personally reviewed and interpreted the radiographs.   Assessment:   53 y.o. male with evidence of moderate osteoarthritis of the left knee.  Denies any history of injury however he does have history of atraumatic  mechanical symptoms and effusions in the right knee which was found to have a medial meniscus tear.  I do believe the majority of his symptoms are result of osteoarthritis but unable to rule out any underlying meniscal pathology.  I have recommended a cortisone injection today for symptomatic relief which patient is agreeable to.  This was performed today and he tolerated the procedure well.  He does plan to continue following up with Dr. Magnus Ivan at which time he can discuss if any further workup is necessary.  Plan :    - Left knee cortisone injection performed today - Continue following with Dr. Magnus Ivan for both knees     Procedure Note  Patient: Jorge Kramer             Date of Birth: Aug 23, 1970           MRN: [id number]             Visit Date: 08/13/2023  Procedures: Visit Diagnoses:  1. Unilateral primary osteoarthritis, left knee     Large Joint Inj: L knee on 08/13/2023 10:15 AM Indications: pain Details: 22 G 1.5 in needle, anterolateral approach Medications: 4 mL lidocaine 1 %; 2 mL triamcinolone acetonide 40 MG/ML Outcome: tolerated well, no immediate complications Procedure, treatment alternatives, risks and benefits explained, specific risks discussed. Consent was given by the patient. Immediately prior to procedure a time out was called to verify the correct patient, procedure, equipment, support staff and site/side marked as required. Patient was prepped and draped in the usual sterile fashion.      I personally saw and evaluated the patient, and participated in the management and treatment plan.  Hazle Nordmann, PA-C Orthopedics

## 2023-08-19 ENCOUNTER — Ambulatory Visit: Payer: No Typology Code available for payment source | Admitting: Physician Assistant

## 2023-09-01 ENCOUNTER — Ambulatory Visit: Payer: No Typology Code available for payment source | Admitting: Orthopaedic Surgery

## 2023-09-08 ENCOUNTER — Other Ambulatory Visit (HOSPITAL_BASED_OUTPATIENT_CLINIC_OR_DEPARTMENT_OTHER): Payer: Self-pay | Admitting: Registered Nurse

## 2023-09-08 DIAGNOSIS — E782 Mixed hyperlipidemia: Secondary | ICD-10-CM

## 2023-09-30 ENCOUNTER — Ambulatory Visit (HOSPITAL_BASED_OUTPATIENT_CLINIC_OR_DEPARTMENT_OTHER)
Admission: RE | Admit: 2023-09-30 | Discharge: 2023-09-30 | Disposition: A | Discharge status: Home / Self Care | Payer: Self-pay | Source: Ambulatory Visit | Attending: Registered Nurse | Admitting: Registered Nurse

## 2023-09-30 DIAGNOSIS — E782 Mixed hyperlipidemia: Secondary | ICD-10-CM | POA: Insufficient documentation

## 2023-11-17 ENCOUNTER — Encounter: Payer: Self-pay | Admitting: Cardiovascular Disease

## 2023-11-17 ENCOUNTER — Ambulatory Visit: Attending: Cardiovascular Disease | Admitting: Cardiovascular Disease

## 2023-11-17 VITALS — BP 138/84 | HR 85 | Ht 68.0 in | Wt 231.2 lb

## 2023-11-17 DIAGNOSIS — I10 Essential (primary) hypertension: Secondary | ICD-10-CM

## 2023-11-17 DIAGNOSIS — Z8249 Family history of ischemic heart disease and other diseases of the circulatory system: Secondary | ICD-10-CM | POA: Insufficient documentation

## 2023-11-17 DIAGNOSIS — E7800 Pure hypercholesterolemia, unspecified: Secondary | ICD-10-CM

## 2023-11-17 DIAGNOSIS — R931 Abnormal findings on diagnostic imaging of heart and coronary circulation: Secondary | ICD-10-CM | POA: Diagnosis not present

## 2023-11-17 MED ORDER — ROSUVASTATIN CALCIUM 20 MG PO TABS: 20.0000 mg | ORAL_TABLET | Freq: Every day | ORAL | 3 refills | Status: AC

## 2023-11-17 NOTE — Assessment & Plan Note (Signed)
 History of hyperlipidemia recently started on rosuvastatin  10 mg a day with a lipid profile performed 10/12/2023 revealing a total cholesterol of 163, LDL of 95 and HDL 44, not at goal for secondary prevention given his mildly elevated coronary calcium  score.  I am going to increase his rosuvastatin  from 10 to 20 mg a day and we will recheck a FLP in 3 months.

## 2023-11-17 NOTE — Progress Notes (Signed)
 11/17/2023 Jorge Kramer   08-08-1970  [id number]  Primary Physician Imelda Man, MD Primary Cardiologist: Avanell Leigh MD Bennye Bravo, [place]  HPI:  Jorge Kramer is a 53 y.o.  mildly overweight married Caucasian male father of  2 children whose wife Christiane Cowing who is a Engineer, civil (consulting) at [employer]. 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  He is being seen today for evaluation of new onset dyspnea on exertion over the last month as well as 3 episodes of chest pain lasting 30 seconds at time associated with diaphoresis. He was seen in the emergency room last night and ruled out for myocardial infarction. His EKG showed no acute changes. He was scheduled for a routine GXT , but because of the new onset of his symptoms I elected to proceed directly to cardiac cath to rule out ischemia. I perform this on 09/10/16 radially demonstrating normal coronary arteries and normal LV function. He was also found to have significant hyper lipidemia currently not on statin therapy as well as hypothyroidism. He was begun on thyroid replacement therapy symptoms are somewhat improved.   Since I saw him 7 years ago he continues to do well.  He is completely asymptomatic.  He was started on low-dose rosuvastatin  and now has an LDL of 95.  Coronary calcium  score performed 10/14/2023 was 107 the majority of which was in the RCA.  Current Meds  Medication Sig   aspirin  EC 81 MG tablet Take 81 mg by mouth daily.   ibuprofen  (ADVIL ) 800 MG tablet TAKE 1 TABLET BY MOUTH EVERY 8 HOURS AS NEEDED   levothyroxine (SYNTHROID, LEVOTHROID) 50 MCG tablet Take 1 tablet by mouth daily.   losartan (COZAAR) 100 MG tablet Take 100 mg by mouth daily.   pantoprazole (PROTONIX) 40 MG tablet Take 40 mg by mouth daily.   rosuvastatin  (CRESTOR ) 10 MG tablet Take 10 mg by mouth daily.     No Known Allergies  Social History   Socioeconomic History   Marital status: Married    Spouse name:  Not on file   Number of children: Not on file   Years of education: Not on file   Highest education level: Not on file  Occupational History   Not on file  Tobacco Use   Smoking status: Never   Smokeless tobacco: Never  Substance and Sexual Activity   Alcohol use: No   Drug use: No   Sexual activity: Not on file  Other Topics Concern   Not on file  Social History Narrative   Not on file   Social Drivers of Health   Financial Resource Strain: Not on file  Food Insecurity: Not on file  Transportation Needs: Not on file  Physical Activity: Not on file  Stress: Not on file  Social Connections: Unknown (12/07/2021)   Received from [facility]   Social Network    Social Network: Not on file  Intimate Partner Violence: Unknown (10/30/2021)   Received from [facility]   HITS    Physically Hurt: Not on file    Insult or Talk Down To: Not on file    Threaten Physical Harm: Not on file    Scream or Curse: Not on file     Review of Systems: General: negative for chills, fever, night sweats or weight changes.  Cardiovascular: negative for chest pain, dyspnea on exertion, edema, orthopnea, palpitations, paroxysmal nocturnal dyspnea or shortness of  breath Dermatological: negative for rash Respiratory: negative for cough or wheezing Urologic: negative for hematuria Abdominal: negative for nausea, vomiting, diarrhea, bright red blood per rectum, melena, or hematemesis Neurologic: negative for visual changes, syncope, or dizziness All other systems reviewed and are otherwise negative except as noted above.    Blood pressure 138/84, pulse 85, height 5\' 8"  (1.727 m), weight 231 lb 3.2 oz (104.9 kg), SpO2 99%.  General appearance: alert and no distress Neck: no adenopathy, no carotid bruit, no JVD, supple, symmetrical, trachea midline, and thyroid not enlarged, symmetric, no tenderness/mass/nodules Lungs: clear to auscultation bilaterally Heart: regular rate and rhythm, S1, S2  normal, no murmur, click, rub or gallop Extremities: extremities normal, atraumatic, no cyanosis or edema Pulses: 2+ and symmetric Skin: Skin color, texture, turgor normal. No rashes or lesions Neurologic: Grossly normal  EKG EKG Interpretation Date/Time:  Wednesday November 17 2023 15:10:12 EDT Ventricular Rate:  84 PR Interval:  144 QRS Duration:  76 QT Interval:  388 QTC Calculation: 458 R Axis:   51  Text Interpretation: Normal sinus rhythm Cannot rule out Anterior infarct , age undetermined When compared with ECG of 07-Sep-2016 20:32, PREVIOUS ECG IS PRESENT Confirmed by Lauro Portal [phone]) on 11/17/2023 3:18:25 PM    ASSESSMENT AND PLAN:   Essential hypertension History of essential hypertension with blood pressure measured today at 138/84.  He is on losartan.  Hyperlipidemia History of hyperlipidemia recently started on rosuvastatin  10 mg a day with a lipid profile performed 10/12/2023 revealing a total cholesterol of 163, LDL of 95 and HDL 44, not at goal for secondary prevention given his mildly elevated coronary calcium  score.  I am going to increase his rosuvastatin  from 10 to 20 mg a day and we will recheck a FLP in 3 months.  Elevated coronary artery calcium  score I performed cardiac catheterization on him 09/10/2016 revealing normal coronary arteries with normal LV function.  He is completely asymptomatic.  Recent coronary calcium  score performed 10/14/2023 was 107 the majority of which was in the RCA.  He is not at goal for secondary prevention.  Will titrate his rosuvastatin .     Avanell Leigh MD [facility], [facility] 11/17/2023 3:25 PM

## 2023-11-17 NOTE — Patient Instructions (Addendum)
 Medication Instructions:  Your physician has recommended you make the following change in your medication:   -Increase rosuvastatin  (crestor ) to 20mg  once daily.  *If you need a refill on your cardiac medications before your next appointment, please call your pharmacy*  Lab Work: Your physician recommends that you return for lab work in: 3 months for FASTING lipid/liver panel  If you have labs (blood work) drawn today and your tests are completely normal, you will receive your results only by: MyChart Message (if you have MyChart) OR A paper copy in the mail If you have any lab test that is abnormal or we need to change your treatment, we will call you to review the results.   Follow-Up: At [facility], you and your health needs are our priority.  As part of our continuing mission to provide you with exceptional heart care, our providers are all part of one team.  This team includes your primary Cardiologist (physician) and Advanced Practice Providers or APPs (Physician Assistants and Nurse Practitioners) who all work together to provide you with the care you need, when you need it.  Your next appointment:   12 month(s)  Provider:   Lauro Portal, MD   We recommend signing up for the patient portal called "MyChart".  Sign up information is provided on this After Visit Summary.  MyChart is used to connect with patients for Virtual Visits (Telemedicine).  Patients are able to view lab/test results, encounter notes, upcoming appointments, etc.  Non-urgent messages can be sent to your provider as well.   To learn more about what you can do with MyChart, go to [email or web address].   Other Instructions       1st Floor: - Lobby - Registration  - Pharmacy  - Lab - Cafe  2nd Floor: - PV Lab - Diagnostic Testing (echo, CT, nuclear med)  3rd Floor: - Vacant  4th Floor: - TCTS (cardiothoracic surgery) - AFib Clinic - Structural Heart Clinic - Vascular Surgery  -  Vascular Ultrasound  5th Floor: - HeartCare Cardiology (general and EP) - Clinical Pharmacy for coumadin, hypertension, lipid, weight-loss medications, and med management appointments    Valet parking services will be available as well.

## 2023-11-17 NOTE — Assessment & Plan Note (Signed)
 Brother at age 52 had a myocardial infarction 1 year ago.

## 2023-11-17 NOTE — Assessment & Plan Note (Signed)
 I performed cardiac catheterization on him 09/10/2016 revealing normal coronary arteries with normal LV function.  He is completely asymptomatic.  Recent coronary calcium  score performed 10/14/2023 was 107 the majority of which was in the RCA.  He is not at goal for secondary prevention.  Will titrate his rosuvastatin .

## 2023-11-17 NOTE — Assessment & Plan Note (Signed)
 History of essential hypertension with blood pressure measured today at 138/84.  He is on losartan.

## 2024-01-04 ENCOUNTER — Ambulatory Visit: Admitting: Cardiovascular Disease

## 2024-02-21 ENCOUNTER — Ambulatory Visit: Admitting: Cardiovascular Disease

## 2024-04-10 LAB — LAB REPORT - SCANNED: EGFR: 72

## 2024-04-17 ENCOUNTER — Other Ambulatory Visit: Payer: Self-pay | Admitting: Registered Nurse

## 2024-04-17 ENCOUNTER — Ambulatory Visit: Payer: Self-pay | Admitting: Cardiovascular Disease

## 2024-04-17 ENCOUNTER — Encounter: Payer: Self-pay | Admitting: Registered Nurse

## 2024-04-17 DIAGNOSIS — R748 Abnormal levels of other serum enzymes: Secondary | ICD-10-CM

## 2024-04-19 ENCOUNTER — Ambulatory Visit
Admission: RE | Admit: 2024-04-19 | Discharge: 2024-04-19 | Disposition: A | Discharge status: Home / Self Care | Source: Ambulatory Visit | Attending: Registered Nurse | Admitting: Registered Nurse

## 2024-04-19 DIAGNOSIS — R748 Abnormal levels of other serum enzymes: Secondary | ICD-10-CM

## 2024-04-24 ENCOUNTER — Ambulatory Visit (INDEPENDENT_AMBULATORY_CARE_PROVIDER_SITE_OTHER): Admitting: Physician Assistant

## 2024-04-24 ENCOUNTER — Other Ambulatory Visit (INDEPENDENT_AMBULATORY_CARE_PROVIDER_SITE_OTHER): Payer: Self-pay

## 2024-04-24 DIAGNOSIS — M25561 Pain in right knee: Secondary | ICD-10-CM

## 2024-04-24 DIAGNOSIS — M170 Bilateral primary osteoarthritis of knee: Secondary | ICD-10-CM

## 2024-04-24 DIAGNOSIS — M1712 Unilateral primary osteoarthritis, left knee: Secondary | ICD-10-CM

## 2024-04-24 DIAGNOSIS — G8929 Other chronic pain: Secondary | ICD-10-CM | POA: Diagnosis not present

## 2024-04-24 DIAGNOSIS — M1711 Unilateral primary osteoarthritis, right knee: Secondary | ICD-10-CM

## 2024-04-24 MED ORDER — METHYLPREDNISOLONE ACETATE 40 MG/ML IJ SUSP
40.0000 mg | INTRAMUSCULAR | Status: AC | PRN
  Administered 2024-04-24: 40 mg via INTRA_ARTICULAR

## 2024-04-24 MED ORDER — LIDOCAINE HCL 1 % IJ SOLN
4.0000 mL | INTRAMUSCULAR | Status: AC | PRN
  Administered 2024-04-24: 4 mL

## 2024-04-24 NOTE — Progress Notes (Signed)
 Office Visit Note   Patient: Jorge Kramer           Date of Birth: 1971-06-29           MRN: [id number] Visit Date: 04/24/2024              Requested by: Jorge Kramer [address] [place],  [place] [address] PCP: Jorge Kramer   Assessment & Plan: Visit Diagnoses:  1. Chronic pain of right knee     Plan: Given the fact the patient osteoarthritis both knees and has done well with gel injections in the past recommend viscosupplementation injections in the knees.  He has no upcoming surgery on either either knee in the next 6 months.  Will work on getting approval for viscosupplementation injections and then having return once these are available.  Questions were encouraged and answered at length.  Follow-Up Instructions: Return for Supplemental injection.   Orders:  Orders Placed This Encounter  Procedures   Large Joint Inj   XR Knee 1-2 Views Right   No orders of the defined types were placed in this encounter.     Procedures: Large Joint Inj: bilateral knee on 04/24/2024 4:52 PM Indications: pain Details: 22 G 1.5 in needle, anterolateral approach  Arthrogram: No  Medications (Right): 4 mL lidocaine  1 %; 40 mg methylPREDNISolone  acetate 40 MG/ML Aspirate (Right): 34 mL yellow Medications (Left): 4 mL lidocaine  1 %; 40 mg methylPREDNISolone  acetate 40 MG/ML Aspirate (Left): 21 mL yellow Outcome: tolerated well, no immediate complications Procedure, treatment alternatives, risks and benefits explained, specific risks discussed. Consent was given by the patient. Immediately prior to procedure a time out was called to verify the correct patient, procedure, equipment, support staff and site/side marked as required. Patient was prepped and draped in the usual sterile fashion.       Clinical Data: No additional findings.   Subjective: Chief Complaint  Patient presents with   Right Knee - Pain   Left Knee - Pain    HPI Jorge Kramer comes  in today due to bilateral knee pain worse over the last 6 months.  He had a cortisone injection in his left knee back in March.  Right knee previous gel injection helped for 5 years.  Currently his left knee pain is worse than his right.  He did some hiking over the weekend.  [place] area and states he has had even increasing pain in the left knee since then.  No known injury.  He has tried creams Tylenol  ibuprofen  and has recently started statin is unsure if this is leading to some of his knee pain.  Review of Systems No fevers or chills.  Objective: Vital Signs: There were no vitals taken for this visit.  Physical Exam Constitutional:      Appearance: He is not ill-appearing or diaphoretic.  Pulmonary:     Effort: Pulmonary effort is normal.  Neurological:     Mental Status: He is alert.  Psychiatric:        Mood and Affect: Mood normal.     Ortho Exam Bilateral knees: Good range of motion of both knees.  Effusion both knees.  No abnormal warmth erythema of either knee.  Slight varus deformity right knee easily correctable.  Left knee tenderness along medial joint line.  Specialty Comments:  No specialty comments available.  Imaging: XR Knee 1-2 Views Right Result Date: 04/24/2024 Right knee 2 views: Bone-on-bone medial compartment severe patellofemoral changes.  Varus  deformity.  Knee is well located.  No acute fractures acute findings.    PMFS History: Patient Active Problem List   Diagnosis Date Noted   Elevated coronary artery calcium  score 11/17/2023   Family history of heart disease 11/17/2023   Hyperlipidemia 09/30/2016   Essential hypertension 09/08/2016   Chest pain 09/08/2016   Past Medical History:  Diagnosis Date   GERD (gastroesophageal reflux disease)    Hypertension     No family history on file.  Past Surgical History:  Procedure Laterality Date   LEFT HEART CATH AND CORONARY ANGIOGRAPHY N/A 09/10/2016   Procedure: Left Heart Cath and  Coronary Angiography;  Surgeon: Dorn JINNY Lesches, Kramer;  Location: [facility] INVASIVE CV LAB;  Service: Cardiovascular;  Laterality: N/A;   TONSILLECTOMY     VASECTOMY     Social History   Occupational History   Not on file  Tobacco Use   Smoking status: Never   Smokeless tobacco: Never  Substance and Sexual Activity   Alcohol use: No   Drug use: No   Sexual activity: Not on file

## 2024-04-25 ENCOUNTER — Other Ambulatory Visit: Payer: Self-pay | Admitting: Radiology

## 2024-04-25 DIAGNOSIS — M1711 Unilateral primary osteoarthritis, right knee: Secondary | ICD-10-CM

## 2024-04-25 DIAGNOSIS — M1712 Unilateral primary osteoarthritis, left knee: Secondary | ICD-10-CM

## 2024-05-29 ENCOUNTER — Encounter: Payer: Self-pay | Admitting: Radiology

## 2024-06-12 ENCOUNTER — Encounter: Payer: Self-pay | Admitting: Orthopaedic Surgery

## 2024-06-12 ENCOUNTER — Ambulatory Visit: Admitting: Orthopaedic Surgery

## 2024-06-12 DIAGNOSIS — M1712 Unilateral primary osteoarthritis, left knee: Secondary | ICD-10-CM

## 2024-06-12 DIAGNOSIS — M170 Bilateral primary osteoarthritis of knee: Secondary | ICD-10-CM | POA: Diagnosis not present

## 2024-06-12 DIAGNOSIS — M1711 Unilateral primary osteoarthritis, right knee: Secondary | ICD-10-CM

## 2024-06-12 MED ORDER — SODIUM HYALURONATE 60 MG/3ML IX PRSY
60.0000 mg | PREFILLED_SYRINGE | INTRA_ARTICULAR | Status: AC | PRN
  Administered 2024-06-12: 60 mg via INTRA_ARTICULAR

## 2024-06-12 NOTE — Progress Notes (Signed)
   Procedure Note  Patient: Jorge Kramer             Date of Birth: 06-25-1971           MRN: [id number]             Visit Date: 06/12/2024  Procedures: Visit Diagnoses:  1. Unilateral primary osteoarthritis, left knee   2. Unilateral primary osteoarthritis, right knee     Large Joint Inj: bilateral knee on 06/12/2024 3:44 PM Indications: pain and diagnostic evaluation Details: 22 G 1.5 in needle, superolateral approach  Arthrogram: No  Medications (Right): 60 mg Sodium Hyaluronate 60 MG/3ML Medications (Left): 60 mg Sodium Hyaluronate 60 MG/3ML Outcome: tolerated well, no immediate complications Procedure, treatment alternatives, risks and benefits explained, specific risks discussed. Consent was given by the patient. Immediately prior to procedure a time out was called to verify the correct patient, procedure, equipment, support staff and site/side marked as required. Patient was prepped and draped in the usual sterile fashion.    The patient comes in today for bilateral hyaluronic acid injections in his knees to treat the pain from osteoarthritis.  Is a very active 53 year old gentleman.  He has had this before in his right knee but has been about 5 years.  He does have some arthritis in both his knees and is trying to stay active without any further surgery.  He denies any significant swelling in his knees.  Having had this before he is fully aware of why we are trying this type of treatment for his knees.  We did place Durolane in both knees today without difficulty.  He knows to wait at least 6 months between these injections but he may not need them again for a while.  He will still work on quad strengthening exercises and low impact aerobic activities.  Lot #[id number]
